# Patient Record
Sex: Female | Born: 1975 | Race: White | Hispanic: No | Marital: Single | Smoking: Current every day smoker
Health system: Southern US, Community
[De-identification: ages and names within clinical notes are randomized; demographics above are authoritative.]

## PROBLEM LIST (undated history)

## (undated) DIAGNOSIS — C539 Malignant neoplasm of cervix uteri, unspecified: Secondary | ICD-10-CM

## (undated) DIAGNOSIS — Z8619 Personal history of other infectious and parasitic diseases: Secondary | ICD-10-CM

## (undated) DIAGNOSIS — N63 Unspecified lump in unspecified breast: Secondary | ICD-10-CM

## (undated) DIAGNOSIS — G971 Other reaction to spinal and lumbar puncture: Secondary | ICD-10-CM

## (undated) HISTORY — PX: ABDOMINAL HYSTERECTOMY: SHX81

## (undated) HISTORY — DX: Personal history of other infectious and parasitic diseases: Z86.19

---

## 2005-01-19 ENCOUNTER — Other Ambulatory Visit: Admission: RE | Admit: 2005-01-19 | Discharge: 2005-01-19 | Payer: Self-pay | Admitting: Obstetrics & Gynecology

## 2005-01-20 ENCOUNTER — Other Ambulatory Visit: Admission: RE | Admit: 2005-01-20 | Discharge: 2005-01-20 | Payer: Self-pay | Admitting: Obstetrics and Gynecology

## 2005-02-02 ENCOUNTER — Observation Stay (HOSPITAL_COMMUNITY): Admission: RE | Admit: 2005-02-02 | Discharge: 2005-02-03 | Payer: Self-pay | Admitting: Obstetrics & Gynecology

## 2005-04-16 ENCOUNTER — Inpatient Hospital Stay (HOSPITAL_COMMUNITY): Admission: AD | Admit: 2005-04-16 | Discharge: 2005-04-16 | Payer: Self-pay | Admitting: Obstetrics & Gynecology

## 2005-04-21 ENCOUNTER — Inpatient Hospital Stay (HOSPITAL_COMMUNITY): Admission: RE | Admit: 2005-04-21 | Discharge: 2005-04-24 | Payer: Self-pay | Admitting: Obstetrics & Gynecology

## 2010-06-29 HISTORY — PX: OTHER SURGICAL HISTORY: SHX169

## 2012-08-23 ENCOUNTER — Emergency Department: Payer: Self-pay | Admitting: Emergency Medicine

## 2012-08-23 LAB — COMPREHENSIVE METABOLIC PANEL
Albumin: 3.7 g/dL (ref 3.4–5.0)
Alkaline Phosphatase: 125 U/L (ref 50–136)
Bilirubin,Total: 0.4 mg/dL (ref 0.2–1.0)
Calcium, Total: 9 mg/dL (ref 8.5–10.1)
Co2: 28 mmol/L (ref 21–32)
EGFR (African American): >60
EGFR (Non-African Amer.): >60
Glucose: 86 mg/dL (ref 65–99)
Potassium: 3.8 mmol/L (ref 3.5–5.1)
SGOT(AST): 15 U/L (ref 15–37)
SGPT (ALT): 28 U/L (ref 12–78)
Sodium: 137 mmol/L (ref 136–145)
Total Protein: 8.7 g/dL — ABNORMAL HIGH (ref 6.4–8.2)

## 2012-08-23 LAB — CBC
HCT: 40.2 % (ref 35.0–47.0)
MCHC: 34.2 g/dL (ref 32.0–36.0)
MCV: 88 fL (ref 80–100)
RDW: 15.5 % — ABNORMAL HIGH (ref 11.5–14.5)
WBC: 8 10*3/uL (ref 3.6–11.0)

## 2012-08-23 LAB — DRUG SCREEN, URINE
Barbiturates, Ur Screen: NEGATIVE (ref ?–200)
Benzodiazepine, Ur Scrn: NEGATIVE (ref ?–200)
Cannabinoid 50 Ng, Ur ~~LOC~~: POSITIVE (ref ?–50)
Cocaine Metabolite,Ur ~~LOC~~: NEGATIVE (ref ?–300)
Methadone, Ur Screen: NEGATIVE (ref ?–300)
Opiate, Ur Screen: POSITIVE (ref ?–300)
Phencyclidine (PCP) Ur S: NEGATIVE (ref ?–25)

## 2012-08-23 LAB — TSH: Thyroid Stimulating Horm: 1.25 u[IU]/mL

## 2012-08-23 LAB — URINALYSIS, COMPLETE
Bilirubin,UR: NEGATIVE
Blood: NEGATIVE
Glucose,UR: NEGATIVE mg/dL (ref 0–75)
Ph: 5 (ref 4.5–8.0)
Specific Gravity: 1.031 (ref 1.003–1.030)

## 2012-08-23 LAB — ETHANOL: Ethanol: <3 mg/dL

## 2013-07-15 DIAGNOSIS — Z3490 Encounter for supervision of normal pregnancy, unspecified, unspecified trimester: Secondary | ICD-10-CM | POA: Insufficient documentation

## 2013-11-22 ENCOUNTER — Ambulatory Visit: Payer: Self-pay

## 2014-05-31 ENCOUNTER — Emergency Department: Payer: Self-pay | Admitting: Emergency Medicine

## 2014-05-31 LAB — CBC WITH DIFFERENTIAL/PLATELET
BASOS PCT: 0.4 %
Basophil #: 0 10*3/uL (ref 0.0–0.1)
EOS PCT: 1.1 %
Eosinophil #: 0.1 10*3/uL (ref 0.0–0.7)
HCT: 38.8 % (ref 35.0–47.0)
HGB: 13.3 g/dL (ref 12.0–16.0)
Lymphocyte #: 2.3 10*3/uL (ref 1.0–3.6)
Lymphocyte %: 21.6 %
MCH: 32.2 pg (ref 26.0–34.0)
MCHC: 34.2 g/dL (ref 32.0–36.0)
MCV: 94 fL (ref 80–100)
MONO ABS: 0.7 x10 3/mm (ref 0.2–0.9)
MONOS PCT: 6.9 %
Neutrophil #: 7.4 10*3/uL — ABNORMAL HIGH (ref 1.4–6.5)
Neutrophil %: 70 %
PLATELETS: 192 10*3/uL (ref 150–440)
RBC: 4.12 10*6/uL (ref 3.80–5.20)
RDW: 13.8 % (ref 11.5–14.5)
WBC: 10.6 10*3/uL (ref 3.6–11.0)

## 2014-05-31 LAB — BASIC METABOLIC PANEL
Anion Gap: 4 — ABNORMAL LOW (ref 7–16)
BUN: 10 mg/dL (ref 7–18)
CALCIUM: 8.8 mg/dL (ref 8.5–10.1)
CHLORIDE: 108 mmol/L — AB (ref 98–107)
Co2: 28 mmol/L (ref 21–32)
Creatinine: 0.83 mg/dL (ref 0.60–1.30)
GLUCOSE: 95 mg/dL (ref 65–99)
OSMOLALITY: 278 (ref 275–301)
POTASSIUM: 4.4 mmol/L (ref 3.5–5.1)
Sodium: 140 mmol/L (ref 136–145)

## 2014-09-07 ENCOUNTER — Emergency Department: Payer: Self-pay | Admitting: Emergency Medicine

## 2016-06-29 DIAGNOSIS — C539 Malignant neoplasm of cervix uteri, unspecified: Secondary | ICD-10-CM

## 2016-06-29 HISTORY — PX: ABDOMINAL HYSTERECTOMY: SHX81

## 2016-06-29 HISTORY — DX: Malignant neoplasm of cervix uteri, unspecified: C53.9

## 2017-04-14 DIAGNOSIS — C539 Malignant neoplasm of cervix uteri, unspecified: Secondary | ICD-10-CM | POA: Insufficient documentation

## 2017-10-08 ENCOUNTER — Encounter: Payer: Self-pay | Admitting: Emergency Medicine

## 2017-10-08 ENCOUNTER — Emergency Department
Admission: EM | Admit: 2017-10-08 | Discharge: 2017-10-08 | Disposition: A | Discharge status: Home / Self Care | Payer: Self-pay | Attending: Emergency Medicine | Admitting: Emergency Medicine

## 2017-10-08 DIAGNOSIS — F17210 Nicotine dependence, cigarettes, uncomplicated: Secondary | ICD-10-CM | POA: Insufficient documentation

## 2017-10-08 DIAGNOSIS — N898 Other specified noninflammatory disorders of vagina: Secondary | ICD-10-CM

## 2017-10-08 DIAGNOSIS — Z8541 Personal history of malignant neoplasm of cervix uteri: Secondary | ICD-10-CM | POA: Insufficient documentation

## 2017-10-08 DIAGNOSIS — N390 Urinary tract infection, site not specified: Secondary | ICD-10-CM | POA: Insufficient documentation

## 2017-10-08 HISTORY — DX: Malignant neoplasm of cervix uteri, unspecified: C53.9

## 2017-10-08 LAB — URINALYSIS, COMPLETE (UACMP) WITH MICROSCOPIC
Bilirubin Urine: NEGATIVE
Glucose, UA: NEGATIVE mg/dL
Hgb urine dipstick: NEGATIVE
Ketones, ur: NEGATIVE mg/dL
Nitrite: POSITIVE — AB
Protein, ur: NEGATIVE mg/dL
Specific Gravity, Urine: 1.017 (ref 1.005–1.030)
pH: 5 (ref 5.0–8.0)

## 2017-10-08 LAB — WET PREP, GENITAL
Clue Cells Wet Prep HPF POC: NONE SEEN
SPERM: NONE SEEN
Trich, Wet Prep: NONE SEEN
Yeast Wet Prep HPF POC: NONE SEEN

## 2017-10-08 LAB — BASIC METABOLIC PANEL
Anion gap: 8 (ref 5–15)
BUN: 9 mg/dL (ref 6–20)
CHLORIDE: 104 mmol/L (ref 101–111)
CO2: 22 mmol/L (ref 22–32)
CREATININE: 0.72 mg/dL (ref 0.44–1.00)
Calcium: 8.9 mg/dL (ref 8.9–10.3)
GFR calc Af Amer: >60 mL/min (ref >60)
GFR calc non Af Amer: >60 mL/min (ref >60)
Glucose, Bld: 97 mg/dL (ref 65–99)
POTASSIUM: 4.4 mmol/L (ref 3.5–5.1)
Sodium: 134 mmol/L — ABNORMAL LOW (ref 135–145)

## 2017-10-08 LAB — CHLAMYDIA/NGC RT PCR (ARMC ONLY)
Chlamydia Tr: NOT DETECTED
N gonorrhoeae: NOT DETECTED

## 2017-10-08 MED ORDER — CEPHALEXIN 500 MG PO CAPS
500.0000 mg | ORAL_CAPSULE | Freq: Once | ORAL | Status: AC
  Administered 2017-10-08: 500 mg via ORAL
  Filled 2017-10-08: qty 1

## 2017-10-08 MED ORDER — CEPHALEXIN 500 MG PO CAPS: 500.0000 mg | ORAL_CAPSULE | Freq: Three times a day (TID) | ORAL | 0 refills | Status: DC

## 2017-10-08 NOTE — ED Triage Notes (Signed)
Patient presents to the ED with painful urination x 3 weeks with thick white vaginal discharge.  Patient reports halting urination and "spasms".  Patient had a hysterectomy in mid-December.

## 2017-10-08 NOTE — ED Provider Notes (Signed)
[facility] Emergency Department Provider Note  Time seen: 8:27 AM  I have reviewed the triage vital signs and the nursing notes.   HISTORY  Chief Complaint Dysuria    HPI Claire Harris is a 42 y.o. female with a past medical history of cervical cancer status post total hysterectomy 4 months ago.  Patient states ever since her hysterectomy 4 months ago she has been having difficulty urinating which she describes as bladder spasm.  States she will urinate a very small amount, states has a very foul odor to it will then need to urinate shortly afterwards again a small amount with urinary frequency.  Denies any dysuria or hematuria.  States the urine is often dark in color foul-smelling and shortly after urinating she feels like she has the urge to urinate again.  She also states over the past 1 week she has been having a thick white vaginal discharge.  Denies any fever, nausea, vomiting.  Denies any abdominal pain, chest pain, largely negative review of systems.   Past Medical History:  Diagnosis Date  . Cervical cancer ([place])     There are no active problems to display for this patient.   Past Surgical History:  Procedure Laterality Date  . ABDOMINAL HYSTERECTOMY     partial/radical  . arm surgery  2012    Prior to Admission medications   Not on File    No Known Allergies  No family history on file.  Social History Social History   Tobacco Use  . Smoking status: Current Every Day Smoker    Packs/day: 1.00    Types: Cigarettes  . Smokeless tobacco: Never Used  Substance Use Topics  . Alcohol use: Not Currently  . Drug use: Not on file    Review of Systems Constitutional: Negative for fever. Eyes: Negative for visual complaints ENT: Negative for recent illness/congestion Cardiovascular: Negative for chest pain. Respiratory: Negative for shortness of breath. Gastrointestinal: Negative for abdominal pain, vomiting and  diarrhea. Genitourinary: Urinary frequency, dark foul-smelling urine.  Thick vaginal discharge times 1 week. Musculoskeletal: Negative for musculoskeletal complaints Skin: Negative for skin complaints  Neurological: Negative for headache All other ROS negative  ____________________________________________   PHYSICAL EXAM:  VITAL SIGNS: ED Triage Vitals  Enc Vitals Group     BP 10/08/17 0731 111/65     Pulse Rate 10/08/17 0731 77     Resp 10/08/17 0731 18     Temp 10/08/17 0731 98.5 F (36.9 C)     Temp Source 10/08/17 0731 Oral     SpO2 10/08/17 0731 99 %     Weight 10/08/17 0732 160 lb (72.6 kg)     Height 10/08/17 0732 5\' 9"  (1.753 m)     Head Circumference --      Peak Flow --      Pain Score 10/08/17 0731 7     Pain Loc --      Pain Edu? --      Excl. in [place]? --    Constitutional: Alert and oriented. Well appearing and in no distress. Eyes: Normal exam ENT   Head: Normocephalic and atraumatic.   Mouth/Throat: Mucous membranes are moist. Cardiovascular: Normal rate, regular rhythm. No murmur Respiratory: Normal respiratory effort without tachypnea nor retractions. Breath sounds are clear  Gastrointestinal: Soft, completely nontender abdominal exam.  No rebound or guarding or distention. Musculoskeletal: Nontender with normal range of motion in all extremities. Neurologic:  Normal speech and language. No gross focal neurologic deficits Skin:  Skin is warm, dry and intact.  Psychiatric: Mood and affect are normal.  ____________________________________________  INITIAL IMPRESSION / ASSESSMENT AND PLAN / ED COURSE  Pertinent labs & imaging results that were available during my care of the patient were reviewed by me and considered in my medical decision making (see chart for details).  Patient presents to the emergency department for urinary frequency and thick vaginal discharge.  Differential would include urinary tract infection, pelvic infection/STD.  We will  check labs including urinalysis and perform a pelvic examination.  Patient agreeable to this plan of care.  Overall patient appears well, reassuring physical exam, normal vitals.  Patient's BMP shows normal kidney function.  CBC has hemolyzed, patient is a very difficult IV stick, we will hold off on CBC testing at this time as the patient appears very well with normal vitals and overall very well-appearing physical examination without concerning findings such as fever or pain.  We will still perform a pelvic examination, urinalysis pending.  Exam shows mild to moderate amount of white discharge.  No obvious tenderness  His wet prep is negative.  I discussed with the patient treatment for STDs, she thinks she has had a very low risk for STDs and wishes to wait for the results to return before any type of treatment.  I believe this is reasonable.  Patient's urinalysis consistent with urinary tract infection.  We will placed on Keflex 3 times daily times 10 days.  Urine culture has been sent.  Patient agreeable to this plan of care.  ____________________________________________   FINAL CLINICAL IMPRESSION(S) / ED DIAGNOSES  Urinary tract infection Vaginal discharge    Harvest Dark, MD 10/08/17 1107

## 2017-10-10 LAB — URINE CULTURE: Culture: >=100000 — AB

## 2017-11-11 ENCOUNTER — Emergency Department
Admission: EM | Admit: 2017-11-11 | Discharge: 2017-11-11 | Disposition: A | Discharge status: Home / Self Care | Payer: Self-pay | Attending: Emergency Medicine | Admitting: Emergency Medicine

## 2017-11-11 ENCOUNTER — Other Ambulatory Visit: Payer: Self-pay

## 2017-11-11 ENCOUNTER — Encounter: Payer: Self-pay | Admitting: Emergency Medicine

## 2017-11-11 DIAGNOSIS — R05 Cough: Secondary | ICD-10-CM | POA: Insufficient documentation

## 2017-11-11 DIAGNOSIS — R509 Fever, unspecified: Secondary | ICD-10-CM | POA: Insufficient documentation

## 2017-11-11 DIAGNOSIS — Z5321 Procedure and treatment not carried out due to patient leaving prior to being seen by health care provider: Secondary | ICD-10-CM | POA: Insufficient documentation

## 2017-11-11 NOTE — ED Triage Notes (Signed)
C/O fever, cough, chills x 2-3 days.

## 2017-12-23 ENCOUNTER — Emergency Department
Admission: EM | Admit: 2017-12-23 | Discharge: 2017-12-23 | Disposition: A | Discharge status: Home / Self Care | Payer: Commercial Managed Care - PPO | Attending: Emergency Medicine | Admitting: Emergency Medicine

## 2017-12-23 ENCOUNTER — Emergency Department: Payer: Commercial Managed Care - PPO

## 2017-12-23 ENCOUNTER — Encounter: Payer: Self-pay | Admitting: Emergency Medicine

## 2017-12-23 DIAGNOSIS — K529 Noninfective gastroenteritis and colitis, unspecified: Secondary | ICD-10-CM | POA: Diagnosis not present

## 2017-12-23 DIAGNOSIS — F17210 Nicotine dependence, cigarettes, uncomplicated: Secondary | ICD-10-CM | POA: Diagnosis not present

## 2017-12-23 DIAGNOSIS — R1033 Periumbilical pain: Secondary | ICD-10-CM | POA: Diagnosis present

## 2017-12-23 LAB — COMPREHENSIVE METABOLIC PANEL
ALBUMIN: 4.1 g/dL (ref 3.5–5.0)
ALT: 40 U/L (ref 0–44)
ANION GAP: 12 (ref 5–15)
AST: 38 U/L (ref 15–41)
Alkaline Phosphatase: 89 U/L (ref 38–126)
BUN: 10 mg/dL (ref 6–20)
CO2: 22 mmol/L (ref 22–32)
Calcium: 9.5 mg/dL (ref 8.9–10.3)
Chloride: 103 mmol/L (ref 98–111)
Creatinine, Ser: 0.73 mg/dL (ref 0.44–1.00)
GFR calc Af Amer: >60 mL/min (ref >60)
GFR calc non Af Amer: >60 mL/min (ref >60)
GLUCOSE: 135 mg/dL — AB (ref 70–99)
POTASSIUM: 3.9 mmol/L (ref 3.5–5.1)
SODIUM: 137 mmol/L (ref 135–145)
Total Bilirubin: 0.4 mg/dL (ref 0.3–1.2)
Total Protein: 8.7 g/dL — ABNORMAL HIGH (ref 6.5–8.1)

## 2017-12-23 LAB — CBC
HEMATOCRIT: 47 % (ref 35.0–47.0)
HEMOGLOBIN: 16 g/dL (ref 12.0–16.0)
MCH: 31.2 pg (ref 26.0–34.0)
MCHC: 34.1 g/dL (ref 32.0–36.0)
MCV: 91.7 fL (ref 80.0–100.0)
Platelets: 183 10*3/uL (ref 150–440)
RBC: 5.13 MIL/uL (ref 3.80–5.20)
RDW: 15.3 % — ABNORMAL HIGH (ref 11.5–14.5)
WBC: 14.2 10*3/uL — ABNORMAL HIGH (ref 3.6–11.0)

## 2017-12-23 LAB — LIPASE, BLOOD: Lipase: 26 U/L (ref 11–51)

## 2017-12-23 LAB — TROPONIN I: Troponin I: <0.03 ng/mL (ref <0.03)

## 2017-12-23 MED ORDER — MORPHINE SULFATE (PF) 4 MG/ML IV SOLN
4.0000 mg | Freq: Once | INTRAVENOUS | Status: AC
  Administered 2017-12-23: 4 mg via INTRAVENOUS
  Filled 2017-12-23: qty 1

## 2017-12-23 MED ORDER — OXYCODONE-ACETAMINOPHEN 5-325 MG PO TABS
1.0000 | ORAL_TABLET | Freq: Once | ORAL | Status: AC
  Administered 2017-12-23: 1 via ORAL
  Filled 2017-12-23: qty 1

## 2017-12-23 MED ORDER — ONDANSETRON HCL 4 MG/2ML IJ SOLN
4.0000 mg | Freq: Once | INTRAMUSCULAR | Status: AC
  Administered 2017-12-23: 4 mg via INTRAVENOUS
  Filled 2017-12-23: qty 2

## 2017-12-23 MED ORDER — METRONIDAZOLE 500 MG PO TABS: 500.0000 mg | ORAL_TABLET | Freq: Two times a day (BID) | ORAL | 0 refills | Status: DC

## 2017-12-23 MED ORDER — HYDROCODONE-ACETAMINOPHEN 5-325 MG PO TABS: 1.0000 | ORAL_TABLET | Freq: Four times a day (QID) | ORAL | 0 refills | Status: DC | PRN

## 2017-12-23 MED ORDER — IOPAMIDOL (ISOVUE-300) INJECTION 61%
100.0000 mL | Freq: Once | INTRAVENOUS | Status: AC | PRN
  Administered 2017-12-23: 100 mL via INTRAVENOUS

## 2017-12-23 MED ORDER — CIPROFLOXACIN HCL 500 MG PO TABS: 500.0000 mg | ORAL_TABLET | Freq: Two times a day (BID) | ORAL | 0 refills | Status: DC

## 2017-12-23 NOTE — ED Provider Notes (Signed)
[facility] Emergency Department Provider Note   ____________________________________________    I have reviewed the triage vital signs and the nursing notes.   HISTORY  Chief Complaint Abdominal Pain     HPI Claire Harris is a 42 y.o. female with a history of cervical cancer, status post hysterectomy who presents with complaints of sharp stabbing severe periumbilical abdominal pain which started overnight.  Patient reports gradual onset.  She reports she works third shift, woke up at 8 PM, felt dizzy and sweaty developed nausea and vomiting with the abdominal pain as described above.  She reports this is been continuous throughout the night.  She is never had this before.  No fevers or chills.  No diarrhea.  No recent travel.  Past Medical History:  Diagnosis Date  . Cervical cancer ([place])     There are no active problems to display for this patient.   Past Surgical History:  Procedure Laterality Date  . ABDOMINAL HYSTERECTOMY     partial/radical  . arm surgery  2012    Prior to Admission medications   Medication Sig Start Date End Date Taking? Authorizing Provider  acetaminophen (TYLENOL) 325 MG tablet Take 650 mg by mouth every 6 (six) hours as needed for mild pain or moderate pain.   Yes [provider]  docusate sodium (COLACE) 100 MG capsule Take 100 mg by mouth 3 (three) times daily as needed for mild constipation.   Yes [provider]  ibuprofen (ADVIL,MOTRIN) 800 MG tablet Take 800 mg by mouth every 8 (eight) hours as needed for moderate pain.   Yes [provider]  senna (SENOKOT) 8.6 MG TABS tablet Take 1 tablet by mouth 3 (three) times daily as needed for mild constipation.   Yes [provider]  cephALEXin (KEFLEX) 500 MG capsule Take 1 capsule (500 mg total) by mouth 3 (three) times daily. Patient not taking: Reported on 12/23/2017 10/08/17   Harvest Dark, MD  ciprofloxacin (CIPRO) 500 MG  tablet Take 1 tablet (500 mg total) by mouth 2 (two) times daily. 12/23/17   Lavonia Drafts, MD  HYDROcodone-acetaminophen (NORCO/VICODIN) 5-325 MG tablet Take 1 tablet by mouth every 6 (six) hours as needed for severe pain. 12/23/17   Lavonia Drafts, MD  metroNIDAZOLE (FLAGYL) 500 MG tablet Take 1 tablet (500 mg total) by mouth 2 (two) times daily after a meal. 12/23/17   Lavonia Drafts, MD     Allergies Patient has no known allergies.  History reviewed. No pertinent family history.  Social History Social History   Tobacco Use  . Smoking status: Current Every Day Smoker    Packs/day: 1.00    Types: Cigarettes  . Smokeless tobacco: Never Used  Substance Use Topics  . Alcohol use: Not Currently  . Drug use: Not on file    Review of Systems  Constitutional: No fever/chills Eyes: No visual changes.  ENT: No sore throat. Cardiovascular: Denies chest pain. Respiratory: Denies shortness of breath. Gastrointestinal: As above Genitourinary: Negative for dysuria.  No vaginal discharge Musculoskeletal: Negative for back pain. Skin: Negative for rash. Neurological: Negative for headaches   ____________________________________________   PHYSICAL EXAM:  VITAL SIGNS: ED Triage Vitals [12/23/17 0642]  Enc Vitals Group     BP 111/78     Pulse Rate 70     Resp 18     Temp 98.1 F (36.7 C)     Temp Source Oral     SpO2 99 %  Weight 72.6 kg (160 lb)     Height      Head Circumference      Peak Flow      Pain Score      Pain Loc      Pain Edu?      Excl. in [place]?     Constitutional: Alert and oriented.  Eyes: Conjunctivae are normal.   Nose: No congestion/rhinnorhea. Mouth/Throat: Mucous membranes are moist.    Cardiovascular: Normal rate, regular rhythm. Grossly normal heart sounds.  Good peripheral circulation. Respiratory: Normal respiratory effort.  No retractions. Lungs CTAB. Gastrointestinal: Mild periumbilical tenderness to palpation, no significant distention.   No CVA tenderness Genitourinary: deferred Musculoskeletal:  Warm and well perfused Neurologic:  Normal speech and language. No gross focal neurologic deficits are appreciated.  Skin:  Skin is warm, dry and intact. No rash noted. Psychiatric: Mood and affect are normal. Speech and behavior are normal.  ____________________________________________   LABS (all labs ordered are listed, but only abnormal results are displayed)  Labs Reviewed  COMPREHENSIVE METABOLIC PANEL - Abnormal; Notable for the following components:      Result Value   Glucose, Bld 135 (*)    Total Protein 8.7 (*)    All other components within normal limits  CBC - Abnormal; Notable for the following components:   WBC 14.2 (*)    RDW 15.3 (*)    All other components within normal limits  LIPASE, BLOOD  TROPONIN I  URINALYSIS, COMPLETE (UACMP) WITH MICROSCOPIC   ____________________________________________  EKG  ED ECG REPORT I, Lavonia Drafts, the attending physician, personally viewed and interpreted this ECG.  Date: 12/23/2017  Rhythm: normal sinus rhythm QRS Axis: normal Intervals: normal ST/T Wave abnormalities: normal Narrative Interpretation: no evidence of acute ischemia  ____________________________________________  RADIOLOGY  CT scan consistent with colitis ____________________________________________   PROCEDURES  Procedure(s) performed: No  Procedures   Critical Care performed: No ____________________________________________   INITIAL IMPRESSION / ASSESSMENT AND PLAN / ED COURSE  Pertinent labs & imaging results that were available during my care of the patient were reviewed by me and considered in my medical decision making (see chart for details).  Patient presents with periumbilical abdominal pain, abdominal exam demonstrates mild tenderness but no distention.  Vital signs unremarkable.  Pending labs.  We will give IV morphine and IV Zofran and IV fluids.  Differential  includes enteritis, gastroenteritis, colitis,  less likely appendicitis.   CT scan is consistent with colitis.  Patient feels significant better after treatment, vitals are normal.  She has not had any diarrhea.  Appropriate for discharge at this point with antibiotics and close outpatient follow-up.  Return precautions discussed    ____________________________________________   FINAL CLINICAL IMPRESSION(S) / ED DIAGNOSES  Final diagnoses:  Colitis        Note:  This document was prepared using Dragon voice recognition software and may include unintentional dictation errors.    Lavonia Drafts, MD 12/23/17 1041

## 2017-12-23 NOTE — ED Triage Notes (Signed)
Pt arrived via EMS from home with all 4 quad. Abdominal pain since last night at 2000. Pt c/o distended abdomin, N/V. Pain described as stabbing. Zofran given in route by EMS.

## 2019-02-05 ENCOUNTER — Other Ambulatory Visit: Payer: Self-pay

## 2019-02-05 ENCOUNTER — Emergency Department
Admission: EM | Admit: 2019-02-05 | Discharge: 2019-02-06 | Disposition: A | Discharge status: Home / Self Care | Payer: Commercial Managed Care - PPO | Attending: Emergency Medicine | Admitting: Emergency Medicine

## 2019-02-05 DIAGNOSIS — N390 Urinary tract infection, site not specified: Secondary | ICD-10-CM | POA: Insufficient documentation

## 2019-02-05 DIAGNOSIS — R300 Dysuria: Secondary | ICD-10-CM

## 2019-02-05 DIAGNOSIS — F4329 Adjustment disorder with other symptoms: Secondary | ICD-10-CM | POA: Insufficient documentation

## 2019-02-05 DIAGNOSIS — R440 Auditory hallucinations: Secondary | ICD-10-CM

## 2019-02-05 DIAGNOSIS — F17210 Nicotine dependence, cigarettes, uncomplicated: Secondary | ICD-10-CM | POA: Insufficient documentation

## 2019-02-05 DIAGNOSIS — Z79899 Other long term (current) drug therapy: Secondary | ICD-10-CM | POA: Insufficient documentation

## 2019-02-05 LAB — COMPREHENSIVE METABOLIC PANEL
ALT: 48 U/L — ABNORMAL HIGH (ref 0–44)
AST: 27 U/L (ref 15–41)
Albumin: 3.9 g/dL (ref 3.5–5.0)
Alkaline Phosphatase: 69 U/L (ref 38–126)
Anion gap: 8 (ref 5–15)
BUN: 9 mg/dL (ref 6–20)
CO2: 27 mmol/L (ref 22–32)
Calcium: 9.2 mg/dL (ref 8.9–10.3)
Chloride: 104 mmol/L (ref 98–111)
Creatinine, Ser: 0.59 mg/dL (ref 0.44–1.00)
GFR calc Af Amer: >60 mL/min (ref >60)
GFR calc non Af Amer: >60 mL/min (ref >60)
Glucose, Bld: 95 mg/dL (ref 70–99)
Potassium: 4 mmol/L (ref 3.5–5.1)
Sodium: 139 mmol/L (ref 135–145)
Total Bilirubin: 0.5 mg/dL (ref 0.3–1.2)
Total Protein: 7.6 g/dL (ref 6.5–8.1)

## 2019-02-05 LAB — URINE DRUG SCREEN, QUALITATIVE (ARMC ONLY)
Amphetamines, Ur Screen: NOT DETECTED
Barbiturates, Ur Screen: NOT DETECTED
Benzodiazepine, Ur Scrn: NOT DETECTED
Cannabinoid 50 Ng, Ur ~~LOC~~: POSITIVE — AB
Cocaine Metabolite,Ur ~~LOC~~: NOT DETECTED
MDMA (Ecstasy)Ur Screen: NOT DETECTED
Methadone Scn, Ur: NOT DETECTED
Opiate, Ur Screen: NOT DETECTED
Phencyclidine (PCP) Ur S: NOT DETECTED
Tricyclic, Ur Screen: NOT DETECTED

## 2019-02-05 LAB — CBC
HCT: 42.1 % (ref 36.0–46.0)
Hemoglobin: 14.4 g/dL (ref 12.0–15.0)
MCH: 32.5 pg (ref 26.0–34.0)
MCHC: 34.2 g/dL (ref 30.0–36.0)
MCV: 95 fL (ref 80.0–100.0)
Platelets: 181 10*3/uL (ref 150–400)
RBC: 4.43 MIL/uL (ref 3.87–5.11)
RDW: 14.3 % (ref 11.5–15.5)
WBC: 7.5 10*3/uL (ref 4.0–10.5)
nRBC: 0 % (ref 0.0–0.2)

## 2019-02-05 LAB — URINALYSIS, COMPLETE (UACMP) WITH MICROSCOPIC
Bacteria, UA: NONE SEEN
Bilirubin Urine: NEGATIVE
Glucose, UA: NEGATIVE mg/dL
Hgb urine dipstick: NEGATIVE
Ketones, ur: NEGATIVE mg/dL
Leukocytes,Ua: NEGATIVE
Nitrite: NEGATIVE
Protein, ur: NEGATIVE mg/dL
Specific Gravity, Urine: 1.009 (ref 1.005–1.030)
pH: 5 (ref 5.0–8.0)

## 2019-02-05 LAB — ETHANOL: Alcohol, Ethyl (B): <10 mg/dL (ref <10)

## 2019-02-05 MED ORDER — CEFUROXIME AXETIL 250 MG PO TABS: 250.0000 mg | ORAL_TABLET | Freq: Two times a day (BID) | ORAL | 0 refills | Status: DC

## 2019-02-05 NOTE — Discharge Instructions (Addendum)
We are treating you for a possible UTI.  We also had our psychiatric team come and evaluate to give you additional resources.  Return to the ER for worsening symptoms or any other concerns.

## 2019-02-05 NOTE — ED Notes (Signed)
Pt yelling loudly from room using foul language and screaming at someone to leave her alone.  This RN entered room to ask pt to tone down the volume and see if she needed any assistance.  Pt states to this RN that there must be a satellite above the Emergency Department and that she just wants the people to leave her alone.  She states that maybe she needs to call the police.  When this RN asks if the people are calling her on the phone she states "it's the satellite above the building."  [place], [place] notified of same.

## 2019-02-05 NOTE — ED Notes (Signed)
FIRST NURSE NOTE:  Pt reports UTI sxs, pt provided mask on arrival to ED.

## 2019-02-05 NOTE — ED Triage Notes (Signed)
Pt presents via POV c/o dysuria. Reports possible UTI. Symptoms x3-4 days.

## 2019-02-05 NOTE — ED Provider Notes (Signed)
[facility] Emergency Department Provider Note  ____________________________________________   First MD Initiated Contact with Patient 02/05/19 1745     (approximate)  I have reviewed the triage vital signs and the nursing notes.   HISTORY  Chief Complaint Dysuria    HPI Claire Harris is a 43 y.o. female presents emergency department with complaints of dysuria.  However while we are waiting her urine test the patient begins to scream and yell at someone to leave her alone but there is no one in the room.  The nurse went in to talk to her and the patient told her that someone was bothering her through the satellite and that she wanted them to be arrested.  I myself went in and the patient told me that she keeps hearing a man talk to her.  He is a famous Scientist, clinical (histocompatibility and immunogenetics).  She thinks he is talking to her after satellites.  When I asked if she was speaking to him on the phone she said no "hold on a minute let me see if I can hear him right this minute "and told me at that time she did not hear him.  In the patient's past medical history are not seeing any history of psych issues.  She will be moved to the major side at this time.    Past Medical History:  Diagnosis Date   Cervical cancer ([place])     There are no active problems to display for this patient.   Past Surgical History:  Procedure Laterality Date   ABDOMINAL HYSTERECTOMY     partial/radical   arm surgery  2012    Prior to Admission medications   Medication Sig Start Date End Date Taking? Authorizing Provider  acetaminophen (TYLENOL) 325 MG tablet Take 650 mg by mouth every 6 (six) hours as needed for mild pain or moderate pain.    [provider]  cephALEXin (KEFLEX) 500 MG capsule Take 1 capsule (500 mg total) by mouth 3 (three) times daily. Patient not taking: Reported on 12/23/2017 10/08/17   Harvest Dark, MD  ciprofloxacin (CIPRO) 500 MG tablet Take 1 tablet (500 mg total) by  mouth 2 (two) times daily. 12/23/17   Lavonia Drafts, MD  docusate sodium (COLACE) 100 MG capsule Take 100 mg by mouth 3 (three) times daily as needed for mild constipation.    [provider]  HYDROcodone-acetaminophen (NORCO/VICODIN) 5-325 MG tablet Take 1 tablet by mouth every 6 (six) hours as needed for severe pain. 12/23/17   Lavonia Drafts, MD  ibuprofen (ADVIL,MOTRIN) 800 MG tablet Take 800 mg by mouth every 8 (eight) hours as needed for moderate pain.    [provider]  metroNIDAZOLE (FLAGYL) 500 MG tablet Take 1 tablet (500 mg total) by mouth 2 (two) times daily after a meal. 12/23/17   Lavonia Drafts, MD  senna (SENOKOT) 8.6 MG TABS tablet Take 1 tablet by mouth 3 (three) times daily as needed for mild constipation.    [provider]    Allergies Patient has no known allergies.  History reviewed. No pertinent family history.  Social History Social History   Tobacco Use   Smoking status: Current Every Day Smoker    Packs/day: 1.00    Types: Cigarettes   Smokeless tobacco: Never Used  Substance Use Topics   Alcohol use: Not Currently   Drug use: Not on file    Review of Systems  Constitutional: No fever/chills, Eyes: No visual changes. ENT: No sore throat. Respiratory: Denies  cough Genitourinary: Positive for dysuria. Musculoskeletal: Negative for back pain. Skin: Negative for rash. Psychiatric: Positive for hearing voices    ____________________________________________   PHYSICAL EXAM:  VITAL SIGNS: ED Triage Vitals  Enc Vitals Group     BP 02/05/19 1717 114/77     Pulse Rate 02/05/19 1717 94     Resp --      Temp 02/05/19 1717 98.2 F (36.8 C)     Temp Source 02/05/19 1717 Oral     SpO2 02/05/19 1717 96 %     Weight 02/05/19 1717 128 lb (58.1 kg)     Height 02/05/19 1717 5\' 9"  (1.753 m)     Head Circumference --      Peak Flow --      Pain Score 02/05/19 1721 0     Pain Loc --      Pain Edu? --      Excl. in [place]? --       Constitutional: Alert and oriented. Well appearing and in no acute distress. Eyes: Conjunctivae are normal.  Head: Atraumatic. Nose: No congestion/rhinnorhea. Mouth/Throat: Mucous membranes are moist.   Neck:  supple no lymphadenopathy noted Cardiovascular: Normal rate, regular rhythm. Heart sounds are normal Respiratory: Normal respiratory effort.  No retractions, lungs c t a  Abd: soft nontender bs normal all 4 quad GU: deferred Musculoskeletal: FROM all extremities, warm and well perfused Neurologic:  Normal speech and language.  Skin:  Skin is warm, dry and intact. No rash noted. Psychiatric: At first the patient's behavior is normal, now she is hearing voices and is agitated ____________________________________________   LABS (all labs ordered are listed, but only abnormal results are displayed)  Labs Reviewed  URINALYSIS, COMPLETE (UACMP) WITH MICROSCOPIC - Abnormal; Notable for the following components:      Result Value   Color, Urine YELLOW (*)    APPearance HAZY (*)    All other components within normal limits  URINE DRUG SCREEN, QUALITATIVE ([facility] ONLY)   ____________________________________________   ____________________________________________  RADIOLOGY    ____________________________________________   PROCEDURES  Procedure(s) performed: No  Procedures    ____________________________________________   INITIAL IMPRESSION / ASSESSMENT AND PLAN / ED COURSE  Pertinent labs & imaging results that were available during my care of the patient were reviewed by me and considered in my medical decision making (see chart for details).   Patient is 43 year old female presents emergency department with concerns of dysuria.  And the meantime she has started to complain of hearing voices and that someone is bothering her through the satellite.  Patient does not have a psych history as noted to her past medical history.  Urine drug screen is added to the  urinalysis.  She will be moved to the major side to be evaluated for the altered mental status.   Clinical Course as of Feb 04 2001  Nancy Fetter Feb 05, 2019  1951 Urine Drug Screen, Qualitative [facility] only) [SF]    Clinical Course User Index [SF] Versie Starks, PA-C   Claire Harris was evaluated in Emergency Department on 02/05/2019 for the symptoms described in the history of present illness. She was evaluated in the context of the global COVID-19 pandemic, which necessitated consideration that the patient might be at risk for infection with the SARS-CoV-2 virus that causes COVID-19. Institutional protocols and algorithms that pertain to the evaluation of patients at risk for COVID-19 are in a state of rapid change based on information released by regulatory bodies including the CDC  and federal and state organizations. These policies and algorithms were followed during the patient's care in the ED.   As part of my medical decision making, I reviewed the following data within the [facility] notes reviewed and incorporated, Old chart reviewed, Evaluated by EM attending Dr. Jari Pigg, Notes from prior ED visits and [place] Controlled Substance Database  ____________________________________________   FINAL CLINICAL IMPRESSION(S) / ED DIAGNOSES  Final diagnoses:  Dysuria  Hearing voices      NEW MEDICATIONS STARTED DURING THIS VISIT:  New Prescriptions   No medications on file     Note:  This document was prepared using Dragon voice recognition software and may include unintentional dictation errors.    Versie Starks, PA-C 02/05/19 Riley Lam, MD 02/11/19 1046

## 2019-02-05 NOTE — ED Provider Notes (Signed)
-----------------------------------------   11:59 PM on 02/05/2019 -----------------------------------------  Assuming care from Dr. Jari Pigg.  In short, Claire Harris is a 43 y.o. female with a chief complaint of dysuria and hearing voices.  Refer to the original H&P for additional details.  I read the written report from psychiatry and they agreed that the patient does not meet involuntary commitment criteria nor inpatient behavioral medicine treatment criteria.  The patient has been given outpatient resources and will be discharged for outpatient follow-up.  She was given return precautions and follow-up recommendations.   Hinda Kehr, MD 02/06/19 0000
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
## 2019-02-05 NOTE — ED Notes (Signed)
Report to [facility] MD

## 2019-02-07 LAB — URINE CULTURE: Culture: NO GROWTH

## 2019-02-08 ENCOUNTER — Encounter: Payer: Self-pay | Admitting: Emergency Medicine

## 2019-02-08 ENCOUNTER — Emergency Department
Admission: EM | Admit: 2019-02-08 | Discharge: 2019-02-09 | Disposition: A | Discharge status: Other Acute Inpatient Hospital | Payer: Medicaid Other | Attending: Emergency Medicine | Admitting: Emergency Medicine

## 2019-02-08 ENCOUNTER — Other Ambulatory Visit: Payer: Self-pay

## 2019-02-08 DIAGNOSIS — F22 Delusional disorders: Secondary | ICD-10-CM

## 2019-02-08 DIAGNOSIS — Z79899 Other long term (current) drug therapy: Secondary | ICD-10-CM | POA: Insufficient documentation

## 2019-02-08 DIAGNOSIS — Z20828 Contact with and (suspected) exposure to other viral communicable diseases: Secondary | ICD-10-CM | POA: Insufficient documentation

## 2019-02-08 DIAGNOSIS — R443 Hallucinations, unspecified: Secondary | ICD-10-CM | POA: Insufficient documentation

## 2019-02-08 DIAGNOSIS — Z8541 Personal history of malignant neoplasm of cervix uteri: Secondary | ICD-10-CM | POA: Insufficient documentation

## 2019-02-08 DIAGNOSIS — F17210 Nicotine dependence, cigarettes, uncomplicated: Secondary | ICD-10-CM | POA: Insufficient documentation

## 2019-02-08 DIAGNOSIS — F23 Brief psychotic disorder: Secondary | ICD-10-CM | POA: Insufficient documentation

## 2019-02-08 LAB — CBC
HCT: 44.7 % (ref 36.0–46.0)
Hemoglobin: 15.4 g/dL — ABNORMAL HIGH (ref 12.0–15.0)
MCH: 32.4 pg (ref 26.0–34.0)
MCHC: 34.5 g/dL (ref 30.0–36.0)
MCV: 94.1 fL (ref 80.0–100.0)
Platelets: 202 10*3/uL (ref 150–400)
RBC: 4.75 MIL/uL (ref 3.87–5.11)
RDW: 14.5 % (ref 11.5–15.5)
WBC: 6.7 10*3/uL (ref 4.0–10.5)
nRBC: 0 % (ref 0.0–0.2)

## 2019-02-08 LAB — COMPREHENSIVE METABOLIC PANEL
ALT: 57 U/L — ABNORMAL HIGH (ref 0–44)
AST: 33 U/L (ref 15–41)
Albumin: 4.4 g/dL (ref 3.5–5.0)
Alkaline Phosphatase: 78 U/L (ref 38–126)
Anion gap: 9 (ref 5–15)
BUN: 10 mg/dL (ref 6–20)
CO2: 28 mmol/L (ref 22–32)
Calcium: 9.6 mg/dL (ref 8.9–10.3)
Chloride: 101 mmol/L (ref 98–111)
Creatinine, Ser: 0.58 mg/dL (ref 0.44–1.00)
GFR calc Af Amer: >60 mL/min (ref >60)
GFR calc non Af Amer: >60 mL/min (ref >60)
Glucose, Bld: 102 mg/dL — ABNORMAL HIGH (ref 70–99)
Potassium: 4.4 mmol/L (ref 3.5–5.1)
Sodium: 138 mmol/L (ref 135–145)
Total Bilirubin: 0.5 mg/dL (ref 0.3–1.2)
Total Protein: 8.5 g/dL — ABNORMAL HIGH (ref 6.5–8.1)

## 2019-02-08 LAB — SARS CORONAVIRUS 2 BY RT PCR (HOSPITAL ORDER, PERFORMED IN ~~LOC~~ HOSPITAL LAB): SARS Coronavirus 2: NEGATIVE

## 2019-02-08 LAB — ETHANOL: Alcohol, Ethyl (B): <10 mg/dL (ref <10)

## 2019-02-08 LAB — ACETAMINOPHEN LEVEL: Acetaminophen (Tylenol), Serum: <10 ug/mL — ABNORMAL LOW (ref 10–30)

## 2019-02-08 LAB — SALICYLATE LEVEL: Salicylate Lvl: <7 mg/dL (ref 2.8–30.0)

## 2019-02-08 NOTE — ED Provider Notes (Signed)
[facility] Emergency Department Provider Note   ____________________________________________   First MD Initiated Contact with Patient 02/08/19 2010     (approximate)  I have reviewed the triage vital signs and the nursing notes.   HISTORY  Chief Complaint Delusions and hallucinations   HPI Claire Harris is a 43 y.o. female with no significant past medical history presents to the ED under IVC from [place].  Patient states that she was brought to see a therapist by police, where she thought she would be able to make a complaint against someone that is stalking her.  After she discussed the person that was talking her with the therapist, she states they did not believe her and involuntarily committed her.  Per IVC paperwork, patient with auditory hallucinations and delusions of being stalked by a "famous rapper".  She denies any SI or HI, specifically states she does not want to harm the person that stalking her, but she does have a reported history of violent behavior in the past.  She does not have any reported psychiatric history, was evaluated in the ED for similar symptoms a few days ago and discharged home.        Past Medical History:  Diagnosis Date  . Cervical cancer [facility])     Patient Active Problem List   Diagnosis Date Noted  . Paranoia ([place]) 02/08/2019    Past Surgical History:  Procedure Laterality Date  . ABDOMINAL HYSTERECTOMY     partial/radical  . arm surgery  2012    Prior to Admission medications   Medication Sig Start Date End Date Taking? Authorizing Provider  acetaminophen (TYLENOL) 325 MG tablet Take 650 mg by mouth every 6 (six) hours as needed for mild pain or moderate pain.    [provider]  cefUROXime (CEFTIN) 250 MG tablet Take 1 tablet (250 mg total) by mouth 2 (two) times daily with a meal for 10 days. 02/05/19 02/15/19  Vanessa Kirby, MD  cephALEXin (KEFLEX) 500 MG capsule Take 1 capsule (500 mg total) by mouth 3  (three) times daily. Patient not taking: Reported on 12/23/2017 10/08/17   Harvest Dark, MD  ciprofloxacin (CIPRO) 500 MG tablet Take 1 tablet (500 mg total) by mouth 2 (two) times daily. 12/23/17   Lavonia Drafts, MD  docusate sodium (COLACE) 100 MG capsule Take 100 mg by mouth 3 (three) times daily as needed for mild constipation.    [provider]  HYDROcodone-acetaminophen (NORCO/VICODIN) 5-325 MG tablet Take 1 tablet by mouth every 6 (six) hours as needed for severe pain. 12/23/17   Lavonia Drafts, MD  ibuprofen (ADVIL,MOTRIN) 800 MG tablet Take 800 mg by mouth every 8 (eight) hours as needed for moderate pain.    [provider]  metroNIDAZOLE (FLAGYL) 500 MG tablet Take 1 tablet (500 mg total) by mouth 2 (two) times daily after a meal. 12/23/17   Lavonia Drafts, MD  senna (SENOKOT) 8.6 MG TABS tablet Take 1 tablet by mouth 3 (three) times daily as needed for mild constipation.    [provider]    Allergies Patient has no known allergies.  No family history on file.  Social History Social History   Tobacco Use  . Smoking status: Current Every Day Smoker    Packs/day: 1.00    Types: Cigarettes  . Smokeless tobacco: Never Used  Substance Use Topics  . Alcohol use: Not Currently  . Drug use: Not Currently    Review of Systems  Constitutional: No  fever/chills Eyes: No visual changes. ENT: No sore throat. Cardiovascular: Denies chest pain. Respiratory: Denies shortness of breath. Gastrointestinal: No abdominal pain.  No nausea, no vomiting.  No diarrhea.  No constipation. Genitourinary: Negative for dysuria. Musculoskeletal: Negative for back pain. Skin: Negative for rash. Neurological: Negative for headaches, focal weakness or numbness.  ____________________________________________   PHYSICAL EXAM:  VITAL SIGNS: ED Triage Vitals  Enc Vitals Group     BP 02/08/19 1948 116/85     Pulse Rate 02/08/19 1948 77     Resp 02/08/19 1948 18      Temp --      Temp Source 02/08/19 1948 Oral     SpO2 02/08/19 1948 100 %     Weight 02/08/19 1947 130 lb (59 kg)     Height 02/08/19 1947 5\' 9"  (1.753 m)     Head Circumference --      Peak Flow --      Pain Score 02/08/19 1952 0     Pain Loc --      Pain Edu? --      Excl. in [place]? --     Constitutional: Alert and oriented. Eyes: Conjunctivae are normal. Head: Atraumatic. Nose: No congestion/rhinnorhea. Mouth/Throat: Mucous membranes are moist. Neck: Normal ROM Cardiovascular: Normal rate, regular rhythm. Grossly normal heart sounds. Respiratory: Normal respiratory effort.  No retractions. Lungs CTAB. Gastrointestinal: Soft and nontender. No distention. Genitourinary: deferred Musculoskeletal: No lower extremity tenderness nor edema. Neurologic:  Normal speech and language. No gross focal neurologic deficits are appreciated. Skin:  Skin is warm, dry and intact. No rash noted. Psychiatric: Mood and affect are normal.  Delusional thought content. ____________________________________________   LABS (all labs ordered are listed, but only abnormal results are displayed)  Labs Reviewed  COMPREHENSIVE METABOLIC PANEL - Abnormal; Notable for the following components:      Result Value   Glucose, Bld 102 (*)    Total Protein 8.5 (*)    ALT 57 (*)    All other components within normal limits  ACETAMINOPHEN LEVEL - Abnormal; Notable for the following components:   Acetaminophen (Tylenol), Serum <10 (*)    All other components within normal limits  CBC - Abnormal; Notable for the following components:   Hemoglobin 15.4 (*)    All other components within normal limits  SARS CORONAVIRUS 2 (HOSPITAL ORDER, [place] [facility])  ETHANOL  SALICYLATE LEVEL  URINE DRUG SCREEN, QUALITATIVE ([facility] ONLY)     PROCEDURES  Procedure(s) performed (including Critical Care):  Procedures   ____________________________________________   INITIAL IMPRESSION /  ASSESSMENT AND PLAN / ED COURSE       43 year old female with no significant psychiatric history presents to the ED under IVC from [place] for auditory hallucinations and delusions.  No apparent medical complaints at this time and screening labs unremarkable, patient medically cleared.  Will consult psychiatry for assistance with disposition.  Labs unremarkable and patient medically cleared.  Pending further evaluation by psychiatry to determine appropriate disposition.      ____________________________________________   FINAL CLINICAL IMPRESSION(S) / ED DIAGNOSES  Final diagnoses:  Delusions ([place])  Hallucinations     ED Discharge Orders    None       Note:  This document was prepared using Dragon voice recognition software and may include unintentional dictation errors.   Blake Divine, MD 02/08/19 [phone]

## 2019-02-08 NOTE — ED Notes (Signed)
Pt. Transferred from Triage to room 23 after dressing out and screening for contraband. Pt. Oriented to [place] including Q15 minute rounds as well as Rover and Officer for their protection. Patient is alert and oriented, warm and dry in no acute distress. Patient denies SI, HI, and AVH. Pt. Encouraged to let me know if needs arise.  

## 2019-02-08 NOTE — ED Notes (Signed)
Hourly rounding reveals patient in room. No complaints, stable, in no acute distress. Q15 minute rounds and monitoring via Rover and Officer to continue.   

## 2019-02-08 NOTE — ED Notes (Signed)
With this nurse and EDT Beth present, pt removes jeans, black footies, blue bra & panties, hair tie, black clutch purse, black sandals, red/white mid length sleeve shirt--all placed in labeled pt belonging bag to be secured on nursing unit and pt changed into burgandy paper scrubs

## 2019-02-08 NOTE — ED Triage Notes (Signed)
Patient ambulatory to triage with steady gait, without difficulty or distress noted; pt accomp by [facility] for IVC; pt reports that her therapist put her under commitment; pt denies any SI or HI

## 2019-02-08 NOTE — Consult Note (Signed)
[facility] Psychiatry Consult   Reason for Consult: Paranoia behaviors Referring Physician: Dr. Charna Archer Patient Identification: Claire Harris MRN:  [id number] Principal Diagnosis:  Diagnosis:  Active Problems:   Paranoia ([place])   Total Time spent with patient: 1.5 hours  Subjective: "I want to go over anything about the guy.  I tried to get help today and the psychiatrist thinks I am crazy." Claire Harris is a 43 y.o. female patient presented to [facility] ED via law enforcement by way of [facility] under involuntary commitment status (IVC).  During patient assessment she was extremely emotional.  Voiced "I do not want to talk about this person anymore" (sobbing).  Patient voiced that she does not want to discuss this "Lennette Bihari" person. Patient voice "I tried talking about it and the psychiatrist said that I was crazy and he "Lennette Bihari" is not real."  The patient endorses, "I know it does not sound real but I am not making this up."  Per [facility] IVC paperwork : She has been complaining about being stalked by a rap star since 05/2018.  The IVC paperwork continues to say, what started as an online interaction with him became in the experience of being watched by him during the month of April 2020.  The patient voiced she was not to bothered by it initially.  She went on to say it became a problem and "I told him to leave.... And he is not being nice about it."  The patient endorses, "it is being a problem for her now."  The patient has no psychiatric diagnosis and has never been follow by a psychiatrist.  The patient has never been prescribed any psychiatric medication.  The patient was seen face-to-face by this provider; chart reviewed and consulted with Dr. Charna Archer on 02/08/2019 due to the care of the patient. It was discussed with the EDP that the patient does meet criteria to be admitted to the psychiatric inpatient unit.  The patient is presenting with paranoia which it is interfering with her day-to-day life.  Her  parents are extremely concerned and believed if this problem is not addressed she could be a danger to herself.  On evaluation the patient is alert and oriented x4, she is very emotional, guarded with most information concerning "Lennette Bihari" but cooperative with other questions. The patient mood-congruent with affect. The patient does not appear to be responding to internal or external stimuli.  She is presenting with some delusional thinking, but refused to elaborate more in that area. The patient denies auditory or visual hallucinations. The patient denies any suicidal, homicidal, or self-harm ideations. The patient is not presenting with any psychotic behaviors.  The patient is paranoid about being seen as "crazy."  During an encounter with the patient, she was able to answer most questions appropriately. Collateral was obtained by the patient's mother Ms. Lindie Roberson [phone] who expresses concerns for patient's paranoia behaviors.  Ms. Neace, acknowledges that her daughter has had many years of polysubstance use, incarceration due to habitual larceny. The patient has 4 children ages range 73, 31, 55 and 24 years old  wish her parents have full custody of the kids with plans to adopt them.  Mom discussed, the patient paranoia behavior has recently surfaced over the last 2 weeks.  She stated, "even when Toney Rakes was doing drugs she never presented with this type of behavior."  She voiced that the patient have a uncle that works at [employer] in [place].  The patient recently contacted her uncle  to ask him if he can help her locate "Lennette Bihari" through the [employer].  Mom is extremely concerned about the patient behavior and does not know what else to do.  Mom states they are very religious and do not believe in medications and mental illness.  She states she and the patient's dad have been praying for her but she is aware the patient does need more in-depth psychiatric care.  Plan: The  patient is a safety risk to self and does require psychiatric inpatient admission for stabilization and treatment.  HPI: Per Dr. Charna Archer; Claire Harris is a 43 y.o. female with no significant past medical history presents to the ED under IVC from [place].  Patient states that she was brought to see a therapist by police, where she thought she would be able to make a complaint against someone that is stalking her.  After she discussed the person that was talking her with the therapist, she states they did not believe her and involuntarily committed her.  Per IVC paperwork, patient with auditory hallucinations and delusions of being stalked by a "famous rapper".  She denies any SI or HI, specifically states she does not want to harm the person that stalking her, but she does have a reported history of violent behavior in the past.  She does not have any reported psychiatric history, was evaluated in the ED for similar symptoms a few days ago and discharged home.  Past Psychiatric History: History reviewed. No pertinent past psychiatric history  Risk to Self:  Yes Risk to Others:  No Prior Inpatient Therapy:  No Prior Outpatient Therapy:  Yes  Past Medical History:  Past Medical History:  Diagnosis Date  . Cervical cancer [facility])     Past Surgical History:  Procedure Laterality Date  . ABDOMINAL HYSTERECTOMY     partial/radical  . arm surgery  2012   Family History: No family history on file. Family Psychiatric  History: History reviewed. No pertinent past family psychiatric history Social History:  Social History   Substance and Sexual Activity  Alcohol Use Not Currently     Social History   Substance and Sexual Activity  Drug Use Not Currently    Social History   Socioeconomic History  . Marital status: Single    Spouse name: Not on file  . Number of children: Not on file  . Years of education: Not on file  . Highest education level: Not on file  Occupational History  . Not on  file  Social Needs  . Financial resource strain: Not on file  . Food insecurity    Worry: Not on file    Inability: Not on file  . Transportation needs    Medical: Not on file    Non-medical: Not on file  Tobacco Use  . Smoking status: Current Every Day Smoker    Packs/day: 1.00    Types: Cigarettes  . Smokeless tobacco: Never Used  Substance and Sexual Activity  . Alcohol use: Not Currently  . Drug use: Not Currently  . Sexual activity: Not on file  Lifestyle  . Physical activity    Days per week: Not on file    Minutes per session: Not on file  . Stress: Not on file  Relationships  . Social Herbalist on phone: Not on file    Gets together: Not on file    Attends religious service: Not on file    Active member of club or organization:  Not on file    Attends meetings of clubs or organizations: Not on file    Relationship status: Not on file  Other Topics Concern  . Not on file  Social History Narrative  . Not on file   Additional Social History:    Allergies:  No Known Allergies  Labs:  Results for orders placed or performed during the hospital encounter of 02/08/19 (from the past 48 hour(s))  Comprehensive metabolic panel     Status: Abnormal   Collection Time: 02/08/19  7:54 PM  Result Value Ref Range   Sodium 138 135 - 145 mmol/L   Potassium 4.4 3.5 - 5.1 mmol/L   Chloride 101 98 - 111 mmol/L   CO2 28 22 - 32 mmol/L   Glucose, Bld 102 (H) 70 - 99 mg/dL   BUN 10 6 - 20 mg/dL   Creatinine, Ser 0.58 0.44 - 1.00 mg/dL   Calcium 9.6 8.9 - 10.3 mg/dL   Total Protein 8.5 (H) 6.5 - 8.1 g/dL   Albumin 4.4 3.5 - 5.0 g/dL   AST 33 15 - 41 U/L   ALT 57 (H) 0 - 44 U/L   Alkaline Phosphatase 78 38 - 126 U/L   Total Bilirubin 0.5 0.3 - 1.2 mg/dL   GFR calc non Af Amer >60 >60 mL/min   GFR calc Af Amer >60 >60 mL/min   Anion gap 9 5 - 15    Comment: Performed at [facility], [address] [place], [place] [address]  Ethanol     Status: None    Collection Time: 02/08/19  7:54 PM  Result Value Ref Range   Alcohol, Ethyl (B) <10 <10 mg/dL    Comment: (NOTE) Lowest detectable limit for serum alcohol is 10 mg/dL. For medical purposes only. Performed at [facility], [address] [place], [place] [address]   Salicylate level     Status: None   Collection Time: 02/08/19  7:54 PM  Result Value Ref Range   Salicylate Lvl <2.3 2.8 - 30.0 mg/dL    Comment: Performed at [facility], [address] [place], [place] [address]  Acetaminophen level     Status: Abnormal   Collection Time: 02/08/19  7:54 PM  Result Value Ref Range   Acetaminophen (Tylenol), Serum <10 (L) 10 - 30 ug/mL    Comment: (NOTE) Therapeutic concentrations vary significantly. A range of 10-30 ug/mL  may be an effective concentration for many patients. However, some  are best treated at concentrations outside of this range. Acetaminophen concentrations >150 ug/mL at 4 hours after ingestion  and >50 ug/mL at 12 hours after ingestion are often associated with  toxic reactions. Performed at [facility], [address] [place], [place] [address]   cbc     Status: Abnormal   Collection Time: 02/08/19  7:54 PM  Result Value Ref Range   WBC 6.7 4.0 - 10.5 K/uL   RBC 4.75 3.87 - 5.11 MIL/uL   Hemoglobin 15.4 (H) 12.0 - 15.0 g/dL   HCT 44.7 36.0 - 46.0 %   MCV 94.1 80.0 - 100.0 fL   MCH 32.4 26.0 - 34.0 pg   MCHC 34.5 30.0 - 36.0 g/dL   RDW 14.5 11.5 - 15.5 %   Platelets 202 150 - 400 K/uL   nRBC 0.0 0.0 - 0.2 %    Comment: Performed at [facility] [address] [place], [place] [address]    No current facility-administered medications for  this encounter.    Current Outpatient Medications  Medication Sig Dispense Refill  . acetaminophen (TYLENOL) 325 MG tablet Take 650 mg by mouth every 6 (six) hours as needed for mild pain or moderate pain.    . cefUROXime (CEFTIN) 250 MG tablet Take 1 tablet (250 mg total)  by mouth 2 (two) times daily with a meal for 10 days. 20 tablet 0  . cephALEXin (KEFLEX) 500 MG capsule Take 1 capsule (500 mg total) by mouth 3 (three) times daily. (Patient not taking: Reported on 12/23/2017) 30 capsule 0  . ciprofloxacin (CIPRO) 500 MG tablet Take 1 tablet (500 mg total) by mouth 2 (two) times daily. 14 tablet 0  . docusate sodium (COLACE) 100 MG capsule Take 100 mg by mouth 3 (three) times daily as needed for mild constipation.    Marland Kitchen HYDROcodone-acetaminophen (NORCO/VICODIN) 5-325 MG tablet Take 1 tablet by mouth every 6 (six) hours as needed for severe pain. 20 tablet 0  . ibuprofen (ADVIL,MOTRIN) 800 MG tablet Take 800 mg by mouth every 8 (eight) hours as needed for moderate pain.    . metroNIDAZOLE (FLAGYL) 500 MG tablet Take 1 tablet (500 mg total) by mouth 2 (two) times daily after a meal. 14 tablet 0  . senna (SENOKOT) 8.6 MG TABS tablet Take 1 tablet by mouth 3 (three) times daily as needed for mild constipation.      Musculoskeletal: Strength & Muscle Tone: within normal limits Gait & Station: normal Patient leans: N/A  Psychiatric Specialty Exam: Physical Exam  Nursing note and vitals reviewed. Constitutional: She is oriented to person, place, and time. She appears well-developed and well-nourished.  HENT:  Head: Normocephalic.  Eyes: Pupils are equal, round, and reactive to light.  Neck: Normal range of motion. Neck supple.  Cardiovascular: Normal rate.  Respiratory: Effort normal.  Musculoskeletal: Normal range of motion.  Neurological: She is alert and oriented to person, place, and time.  Skin: Skin is warm and dry.    Review of Systems  Psychiatric/Behavioral: Positive for depression. The patient is nervous/anxious.   All other systems reviewed and are negative.   Blood pressure 116/85, pulse 77, resp. rate 18, height 5\' 9"  (1.753 m), weight 59 kg, SpO2 100 %.Body mass index is 19.2 kg/m.  General Appearance: Casual  Eye Contact:  Fair  Speech:   Clear and Coherent  Volume:  Decreased  Mood:  Anxious, Depressed and Irritable  Affect:  Congruent, Depressed, Flat, Restricted and Tearful  Thought Process:  Coherent  Orientation:  Full (Time, Place, and Person)  Thought Content:  Logical  Suicidal Thoughts:  No  Homicidal Thoughts:  No  Memory:  Immediate;   Good Recent;   Good Remote;   Good  Judgement:  Fair  Insight:  Lacking  Psychomotor Activity:  Decreased  Concentration:  Concentration: Good and Attention Span: Good  Recall:  Good  Fund of Knowledge:  Good  Language:  Good  Akathisia:  Negative  Handed:  Right  AIMS (if indicated):     Assets:  Communication Skills Desire for Improvement Social Support  ADL's:  Intact  Cognition:  WNL  Sleep:   Good     Treatment Plan Summary: Medication management and Plan Patient does meet criteria for psychiatric inpatient admission once a bed becomes available.  Disposition: Recommend psychiatric Inpatient admission when medically cleared. Supportive therapy provided about ongoing stressors.  Caroline Sauger, NP 02/08/2019 10:47 PM

## 2019-02-09 ENCOUNTER — Other Ambulatory Visit: Payer: Self-pay

## 2019-02-09 ENCOUNTER — Inpatient Hospital Stay
Admission: EM | Admit: 2019-02-09 | Discharge: 2019-02-14 | DRG: 885 | Disposition: A | Discharge status: Home / Self Care | Payer: Self-pay | Source: Intra-hospital | Attending: Psychiatry | Admitting: Psychiatry

## 2019-02-09 DIAGNOSIS — F22 Delusional disorders: Principal | ICD-10-CM | POA: Diagnosis present

## 2019-02-09 DIAGNOSIS — F29 Unspecified psychosis not due to a substance or known physiological condition: Secondary | ICD-10-CM | POA: Diagnosis present

## 2019-02-09 DIAGNOSIS — F1210 Cannabis abuse, uncomplicated: Secondary | ICD-10-CM

## 2019-02-09 DIAGNOSIS — F17210 Nicotine dependence, cigarettes, uncomplicated: Secondary | ICD-10-CM | POA: Diagnosis present

## 2019-02-09 DIAGNOSIS — Z7289 Other problems related to lifestyle: Secondary | ICD-10-CM

## 2019-02-09 DIAGNOSIS — Z20828 Contact with and (suspected) exposure to other viral communicable diseases: Secondary | ICD-10-CM | POA: Diagnosis present

## 2019-02-09 MED ORDER — HYDROXYZINE HCL 25 MG PO TABS
25.0000 mg | ORAL_TABLET | Freq: Three times a day (TID) | ORAL | Status: DC | PRN
  Administered 2019-02-09: 25 mg via ORAL
  Filled 2019-02-09 (×4): qty 1

## 2019-02-09 MED ORDER — ACETAMINOPHEN 325 MG PO TABS
650.0000 mg | ORAL_TABLET | Freq: Four times a day (QID) | ORAL | Status: DC | PRN
  Administered 2019-02-10: 650 mg via ORAL
  Filled 2019-02-09: qty 2

## 2019-02-09 MED ORDER — TRAZODONE HCL 50 MG PO TABS
50.0000 mg | ORAL_TABLET | Freq: Every evening | ORAL | Status: DC | PRN
  Administered 2019-02-09 – 2019-02-13 (×4): 50 mg via ORAL
  Filled 2019-02-09 (×6): qty 1

## 2019-02-09 MED ORDER — MAGNESIUM HYDROXIDE 400 MG/5ML PO SUSP: 30.0000 mL | Freq: Every day | ORAL | Status: DC | PRN

## 2019-02-09 MED ORDER — ALUM & MAG HYDROXIDE-SIMETH 200-200-20 MG/5ML PO SUSP: 30.0000 mL | ORAL | Status: DC | PRN

## 2019-02-09 NOTE — ED Notes (Signed)
Hourly rounding reveals patient in room. No complaints, stable, in no acute distress. Q15 minute rounds and monitoring via Rover and Officer to continue.   

## 2019-02-09 NOTE — Progress Notes (Signed)
Admission Note:   Report was received on a 43 year old female who presents IVC in no acute distress for the treatment of Psychosis (paranoia) and Anxiety. Per report, patient feels like a rapper named Lennette Bihari is following her, Patient appears anxious, and rated her anxiety a "10/10" stating that "this whole situation with this man, he will not leave me alone". Patient was calm and cooperative with admission process. Patient contracts for safety upon admission. Patient denies SI/HI/AVH and pain at this time. Patient also denies any signs/symptoms of depression to this Probation officer. Patient has no significant past medical history. Skin was assessed with Junita Push, RN and found to be clear of any abnormal marks apart from old scarring from "past opiate IV use" per patient, to her arms and face. Patient searched and no contraband found and unit policies explained and understanding verbalized. Consents obtained. Food and fluids offered, and fluids accepted. Patient had no additional questions or concerns.

## 2019-02-09 NOTE — Tx Team (Signed)
Initial Treatment Plan 02/09/2019 6:33 PM Claire Harris [id number]    PATIENT STRESSORS: Financial difficulties Other: Paranoid   PATIENT STRENGTHS: Capable of independent living Communication skills General fund of knowledge   PATIENT IDENTIFIED PROBLEMS: Paranoia  Psychosis                   DISCHARGE CRITERIA:  Ability to meet basic life and health needs Improved stabilization in mood, thinking, and/or behavior Motivation to continue treatment in a less acute level of care Reduction of life-threatening or endangering symptoms to within safe limits  PRELIMINARY DISCHARGE PLAN: Outpatient therapy Return to previous living arrangement  PATIENT/FAMILY INVOLVEMENT: This treatment plan has been presented to and reviewed with the patient, Claire Harris.The patient has been given the opportunity to ask questions and make suggestions.  Claire Helming, RN 02/09/2019, 6:33 PM

## 2019-02-09 NOTE — BH Assessment (Signed)
Patient is to be admitted to [facility] by NP J. Grandville Silos.  Attending Physician will be Dr. Weber Cooks.   Patient has been assigned to room 301, by [facility] Charge Nurse  Berneta Levins.   Intake Paper Work has been signed and placed on patient chart.  ER staff is aware of the admission:

## 2019-02-09 NOTE — ED Notes (Signed)
Pt discharged under IVC to BMU. Report given to [facility], Therapist, sports. VS stable. Belongings sent with patient.

## 2019-02-09 NOTE — Plan of Care (Signed)
New admission.   Problem: Education: Goal: Knowledge of [facility] Education information/materials will improve Outcome: Not Progressing Goal: Emotional status will improve Outcome: Not Progressing Goal: Mental status will improve Outcome: Not Progressing Goal: Verbalization of understanding the information provided will improve Outcome: Not Progressing   Problem: Self-Concept: Goal: Ability to identify factors that promote anxiety will improve Outcome: Not Progressing Goal: Level of anxiety will decrease Outcome: Not Progressing   Problem: Education: Goal: Will be free of psychotic symptoms Outcome: Not Progressing Goal: Knowledge of the prescribed therapeutic regimen will improve Outcome: Not Progressing   Problem: Health Behavior/Discharge Planning: Goal: Compliance with prescribed medication regimen will improve Outcome: Not Progressing

## 2019-02-09 NOTE — BH Assessment (Addendum)
Assessment Note  Claire Harris is an 43 y.o. female.  The pt came in after being IVC'd by [facility].  The pt reported she is being stalked by a famous rapped by the name Lennette Bihari,  She stated she met the rapper 8 months ago and thought he was a friend.  Lennette Bihari is now watching her.  She stated she isn't sure where Lennette Bihari lives, but thinks he lives in [place].  According to the IVC this information isn't true.  The pt denies any mental health treatment in the past.  She denies SI and HI.  The pt lives in her own home, but stated she has homeless people live there also.  She is currently unemployed and last worked about 2 months ago.  She denies self harm and history of abuse.  The pt reported she is on probation for habitual larceny.  She reports she is sleeping and eating well.  She reports using marijuana and last used 3 days ago.  Her UDS is pending at this time.  Pt is dressed in scrubs. She is alert and oriented x4. Pt speaks in a clear tone, at moderate volume and normal pace. Eye contact is good. Pt's mood is pleasant. Thought process is coherent and relevant. There is indication Pt is currently responding to internal stimuli or experiencing delusional thought content.?Pt was cooperative throughout assessment.    Diagnosis: F23 Brief psychotic disorder  Past Medical History:  Past Medical History:  Diagnosis Date  . Cervical cancer [facility])     Past Surgical History:  Procedure Laterality Date  . ABDOMINAL HYSTERECTOMY     partial/radical  . arm surgery  2012    Family History: No family history on file.  Social History:  reports that she has been smoking cigarettes. She has been smoking about 1.00 pack per day. She has never used smokeless tobacco. She reports previous alcohol use. She reports previous drug use.  Additional Social History:  Alcohol / Drug Use Pain Medications: See MAR Prescriptions: See MAR Over the Counter: See MAR History of alcohol / drug use?: Yes Longest period of  sobriety (when/how long): NA Substance #1 Name of Substance 1: marijuana 1 - Age of First Use: 18 1 - Amount (size/oz): one gram 1 - Frequency: daily 1 - Last Use / Amount: 02/06/2019  CIWA: CIWA-Ar BP: 116/85 Pulse Rate: 77 COWS:    Allergies: No Known Allergies  Home Medications: (Not in a hospital admission)   OB/GYN Status:  No LMP recorded. Patient has had a hysterectomy.  General Assessment Data Location of Assessment: [facility] ED TTS Assessment: In system Is this a Tele or Face-to-Face Assessment?: Face-to-Face Is this an Initial Assessment or a Re-assessment for this encounter?: Initial Assessment Patient Accompanied by:: N/A Language Other than English: No Living Arrangements: Other (Comment)(in a house) What gender do you identify as?: Female Marital status: Single Maiden name: Cech Pregnancy Status: No Living Arrangements: Non-relatives/Friends Can pt return to current living arrangement?: Yes Admission Status: Involuntary Petitioner: Other([facility]) Is patient capable of signing voluntary admission?: No Referral Source: Self/Family/Friend Insurance type: Medicaid     Crisis Care Plan Living Arrangements: Non-relatives/Friends Legal Guardian: Other:(Self) Name of Psychiatrist: none Name of Therapist: none  Education Status Is patient currently in school?: No Is the patient employed, unemployed or receiving disability?: Unemployed  Risk to self with the past 6 months Suicidal Ideation: No Has patient been a risk to self within the past 6 months prior to admission? : No Suicidal Intent: No Has  patient had any suicidal intent within the past 6 months prior to admission? : No Is patient at risk for suicide?: No Suicidal Plan?: No Has patient had any suicidal plan within the past 6 months prior to admission? : No Access to Means: No What has been your use of drugs/alcohol within the last 12 months?: marijuana use Previous Attempts/Gestures: No How many  times?: 0 Other Self Harm Risks: none Triggers for Past Attempts: None known Intentional Self Injurious Behavior: None Family Suicide History: No Recent stressful life event(s): Other (Comment)(pt reported she is being stalked) Persecutory voices/beliefs?: No Depression: No Substance abuse history and/or treatment for substance abuse?: Yes Suicide prevention information given to non-admitted patients: Not applicable  Risk to Others within the past 6 months Homicidal Ideation: No Does patient have any lifetime risk of violence toward others beyond the six months prior to admission? : No Thoughts of Harm to Others: No Current Homicidal Intent: No Current Homicidal Plan: No Access to Homicidal Means: No Identified Victim: pt denies History of harm to others?: No Assessment of Violence: None Noted Violent Behavior Description: pt denies Does patient have access to weapons?: No Criminal Charges Pending?: No Does patient have a court date: No Is patient on probation?: Yes  Psychosis Hallucinations: None noted Delusions: Persecutory  Mental Status Report Appearance/Hygiene: In hospital gown, In scrubs Eye Contact: Good Motor Activity: Freedom of movement, Unremarkable Speech: Logical/coherent Level of Consciousness: Alert Mood: Pleasant Affect: Appropriate to circumstance Anxiety Level: None Thought Processes: Coherent, Relevant Judgement: Impaired Orientation: Person, Place, Time, Situation Obsessive Compulsive Thoughts/Behaviors: None  Cognitive Functioning Concentration: Normal Memory: Recent Intact, Remote Intact Is patient IDD: No Insight: Poor Impulse Control: Poor Appetite: Good Have you had any weight changes? : No Change Sleep: No Change Total Hours of Sleep: 8 Vegetative Symptoms: None  ADLScreening [facility] Assessment Services) Patient's cognitive ability adequate to safely complete daily activities?: Yes Patient able to express need for assistance with  ADLs?: Yes Independently performs ADLs?: Yes (appropriate for developmental age)  Prior Inpatient Therapy Prior Inpatient Therapy: No  Prior Outpatient Therapy Prior Outpatient Therapy: No Does patient have an ACCT team?: No Does patient have Intensive In-House Services?  : No Does patient have [facility] services? : No Does patient have P4CC services?: No  ADL Screening (condition at time of admission) Patient's cognitive ability adequate to safely complete daily activities?: Yes Patient able to express need for assistance with ADLs?: Yes Independently performs ADLs?: Yes (appropriate for developmental age)       Abuse/Neglect Assessment (Assessment to be complete while patient is alone) Abuse/Neglect Assessment Can Be Completed: Yes Physical Abuse: Denies Verbal Abuse: Denies Sexual Abuse: Denies Exploitation of patient/patient's resources: Denies Self-Neglect: Denies Values / Beliefs Cultural Requests During Hospitalization: None Spiritual Requests During Hospitalization: None Consults Spiritual Care Consult Needed: No Social Work Consult Needed: No Regulatory affairs officer (For Healthcare) Does Patient Have a Medical Advance Directive?: No Would patient like information on creating a medical advance directive?: No - Patient declined          Disposition:  Disposition Initial Assessment Completed for this Encounter: Yes  NP J. Grandville Silos recommends inpatient treatment.  On Site Evaluation by:   Reviewed with Physician:    Enzo Montgomery 02/09/2019 3:22 AM

## 2019-02-10 DIAGNOSIS — F1210 Cannabis abuse, uncomplicated: Secondary | ICD-10-CM

## 2019-02-10 DIAGNOSIS — F29 Unspecified psychosis not due to a substance or known physiological condition: Secondary | ICD-10-CM

## 2019-02-10 DIAGNOSIS — F22 Delusional disorders: Principal | ICD-10-CM

## 2019-02-10 MED ORDER — RISPERIDONE 1 MG PO TBDP
1.0000 mg | ORAL_TABLET | Freq: Every day | ORAL | Status: DC
  Administered 2019-02-10 – 2019-02-12 (×3): 1 mg via ORAL
  Filled 2019-02-10 (×3): qty 1

## 2019-02-10 NOTE — [facility] Suicide Risk Assessment (Signed)
[place] INPATIENT:  Family/Significant Other Suicide Prevention Education  Suicide Prevention Education:  Education Completed; Rudy, [place], father, [phone]   has been identified by the patient as the family member/significant other with whom the patient will be residing, and identified as the person(s) who will aid the patient in the event of a mental health crisis (suicidal ideations/suicide attempt).  With written consent from the patient, the family member/significant other has been provided the following suicide prevention education, prior to the and/or following the discharge of the patient.  The suicide prevention education provided includes the following:  Suicide risk factors  Suicide prevention and interventions  National Suicide Hotline telephone number  [facility] assessment telephone number  [facility] Emergency Assistance [place] and/or Residential Mobile Crisis Unit telephone number  Request made of family/significant other to:  Remove weapons (e.g., guns, rifles, knives), all items previously/currently identified as safety concern.    Remove drugs/medications (over-the-counter, prescriptions, illicit drugs), all items previously/currently identified as a safety concern.  The family member/significant other verbalizes understanding of the suicide prevention education information provided.  The family member/significant other agrees to remove the items of safety concern listed above.  Father reports "she has been telling me for some time that she has been followed by some rapper named Marylee Floras". Father reports "she also has told me that she has recorded people talking in her home.  I've listened to the recordings and I never hear anything but normal house noises."  CSW asked if it could be the homeless people that patient allegedly lets live with her.  Father reports that he does not think it is the homeless people and reports that he is  aware that she lets homeless people live with her.  Father also states that he has never been in the patient's home and has never seen the homeless people the patient allows to stay with her.  Father reports "she tells me that she communicates with this Lennette Bihari mentally".  Father reports no concerns for weapons.  Father reports that he does not think this is substance related "as she is speaking too clearly".    Rozann Lesches 02/10/2019, 11:39 AM

## 2019-02-10 NOTE — Progress Notes (Signed)
Patient alert and oriented x 4, affect is flat but she brighten upon approach, patient's thoughts is organized and coherent, no distress noted interacting appropriately with peers and staff. Patient denies paranoia, and appears less anxious rated anxiety a 5/10. Patient denies SI/HI/AVH, emotional support given to patient, compliant with medication regimen, 15 minutes safety checks maintained will continue to monitor.

## 2019-02-10 NOTE — Progress Notes (Signed)
Recreation Therapy Notes  INPATIENT RECREATION THERAPY ASSESSMENT  Patient Details Name: Anique Beckley MRN: [id number] DOB: 1975/12/27 Today's Date: 02/10/2019       Information Obtained From: Patient  Able to Participate in Assessment/Interview: Yes  Patient Presentation: Responsive  Reason for Admission (Per Patient): Active Symptoms  Patient Stressors:    Coping Skills:   Other (Comment)(None)  Leisure Interests (2+):  Music - Listen, Games - Music therapist, Games - Cross-word  Frequency of Recreation/Participation: Biomedical engineer of Community Resources:     [employer]:     Current Use:    If no, Barriers?:    Expressed Interest in [place]idence:  Insurance underwriter  Patient Main Form of Transportation: Other (Comment)(Friends)  Patient Strengths:  Good heart  Patient Identified Areas of Improvement:  Learning to isolate  Patient Goal for Hospitalization:  To leave  Current SI (including self-harm):  No  Current HI:  No  Current AVH: No  Staff Intervention Plan: Group Attendance, Collaborate with Interdisciplinary Treatment Team  Consent to Intern Participation: N/A  Clarence Dunsmore 02/10/2019, 3:34 PM

## 2019-02-10 NOTE — H&P (Signed)
Psychiatric Admission Assessment Adult  Patient Identification: Jaquia Benedicto MRN:  [id number] Date of Evaluation:  02/10/2019 Chief Complaint:  psychosis Principal Diagnosis: Psychosis ([place]) Diagnosis:  Principal Problem:   Psychosis ([place]) Active Problems:   Cannabis abuse  History of Present Illness: Patient seen and chart reviewed.  Patient brought to the emergency room from [place] where she was taken by police for an evaluation.  Patient is very guarded and unwilling to share details but she did discuss the basic situation.  Patient was involved with a man she supposedly met on the Internet whom she refers to as "Lennette Bihari".  At some point recently she decided to end the relationship but since then believes that he has been communicating with her influencing her and somehow transmitting information to her.  She says it is very hard to explain exactly what she is experiencing.  She told people in the emergency room that he had satellites that were communicating with her.  She tells me about how she is hearing voices at various places inside her home which she believes he is using technology to transmit at her.  She is unable to speculate about any particular motive to this.  She is clearly disturbed by this.  Denies however being depressed denies suicidal thoughts denies homicidal thoughts.  Denies sleep or appetite problems.  Not currently getting any psychiatric treatment.  Admits to use of marijuana but denies any other drugs recently.  Family when contacted have been aware of her paranoia and odd thinking.  They are also concerned because she is reportedly letting homeless people live inside her house although the details of that are unclear. Associated Signs/Symptoms: Depression Symptoms:  difficulty concentrating, (Hypo) Manic Symptoms:  Distractibility, Hallucinations, Anxiety Symptoms:  Nonspecific Psychotic Symptoms:  Hallucinations: Auditory Ideas of Reference, Paranoia, PTSD  Symptoms: Negative Total Time spent with patient: 1 hour  Past Psychiatric History: Patient claims she has never had any kind of psychiatric treatment in the past other than seeing a Suboxone provider.  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  Was able to get into a Suboxone program but even with that continued narcotic abuse.  Eventually went to prison.  Says that since she has been out of prison she has not relapsed onto drugs.  Denies any history of suicide attempts or violence.  Denies ever being on psychiatric medicine in the past.  She was evaluated in our emergency room just a couple days prior to this admission for the same exact symptoms but at that time was felt to not require hospitalization  Is the patient at risk to self? Yes.    Has the patient been a risk to self in the past 6 months? No.  Has the patient been a risk to self within the distant past? No.  Is the patient a risk to others? No.  Has the patient been a risk to others in the past 6 months? No.  Has the patient been a risk to others within the distant past? No.   Prior Inpatient Therapy:   Prior Outpatient Therapy:    Alcohol Screening: 1. How often do you have a drink containing alcohol?: Never 2. How many drinks containing alcohol do you have on a typical day when you are drinking?: 1 or 2 3. How often do you have six or more drinks on one occasion?: Never AUDIT-C Score: 0 4. How often during the last year have you found that you were not able  to stop drinking once you had started?: Never 5. How often during the last year have you failed to do what was normally expected from you becasue of drinking?: Never 6. How often during the last year have you needed a first drink in the morning to get yourself going after a heavy drinking session?: Never 7. How often during the last year have you had a feeling of guilt of remorse after drinking?: Never 8. How often during the last year have you  been unable to remember what happened the night before because you had been drinking?: Never 9. Have you or someone else been injured as a result of your drinking?: No 10. Has a relative or friend or a doctor or another health worker been concerned about your drinking or suggested you cut down?: No Alcohol Use Disorder Identification Test Final Score (AUDIT): 0 Alcohol Brief Interventions/Follow-up: AUDIT Score <7 follow-up not indicated Substance Abuse History in the last 12 months:  Yes.   Consequences of Substance Abuse: Medical Consequences:  Possible worsening or contribution to psychotic symptoms Previous Psychotropic Medications: No  Psychological Evaluations: No  Past Medical History:  Past Medical History:  Diagnosis Date  . Cervical cancer [facility])     Past Surgical History:  Procedure Laterality Date  . ABDOMINAL HYSTERECTOMY     partial/radical  . arm surgery  2012   Family History: History reviewed. No pertinent family history. Family Psychiatric  History: Denies any family history of psychiatric problems Tobacco Screening: Have you used any form of tobacco in the last 30 days? (Cigarettes, Smokeless Tobacco, Cigars, and/or Pipes): Yes Tobacco use, Select all that apply: 5 or more cigarettes per day Are you interested in Tobacco Cessation Medications?: Yes, will notify MD for an order Counseled patient on smoking cessation including recognizing danger situations, developing coping skills and basic information about quitting provided: Refused/Declined practical counseling Social History:  Social History   Substance and Sexual Activity  Alcohol Use Not Currently     Social History   Substance and Sexual Activity  Drug Use Not Currently    Additional Social History: Marital status: Single Are you sexually active?: No What is your sexual orientation?: Hetersexual Has your sexual activity been affected by drugs, alcohol, medication, or emotional stress?: Pt  denies. Does patient have children?: Yes How many children?: 4 How is patient's relationship with their children?: Pt reports that she has 4 children (24, 58, 73, 5),  Pt reports that her parents have custody of her children.  "I just got out of jail 2 years ago and I am trying to get myself together." Pt reports.    Pain Medications: see PTA Prescriptions: see PTA Over the Counter: see PTA History of alcohol / drug use?: Yes                    Allergies:  No Known Allergies Lab Results:  Results for orders placed or performed during the hospital encounter of 02/08/19 (from the past 48 hour(s))  Comprehensive metabolic panel     Status: Abnormal   Collection Time: 02/08/19  7:54 PM  Result Value Ref Range   Sodium 138 135 - 145 mmol/L   Potassium 4.4 3.5 - 5.1 mmol/L   Chloride 101 98 - 111 mmol/L   CO2 28 22 - 32 mmol/L   Glucose, Bld 102 (H) 70 - 99 mg/dL   BUN 10 6 - 20 mg/dL   Creatinine, Ser 0.58 0.44 - 1.00 mg/dL   Calcium  9.6 8.9 - 10.3 mg/dL   Total Protein 8.5 (H) 6.5 - 8.1 g/dL   Albumin 4.4 3.5 - 5.0 g/dL   AST 33 15 - 41 U/L   ALT 57 (H) 0 - 44 U/L   Alkaline Phosphatase 78 38 - 126 U/L   Total Bilirubin 0.5 0.3 - 1.2 mg/dL   GFR calc non Af Amer >60 >60 mL/min   GFR calc Af Amer >60 >60 mL/min   Anion gap 9 5 - 15    Comment: Performed at [facility], [address], [place], [place] [address]  Ethanol     Status: None   Collection Time: 02/08/19  7:54 PM  Result Value Ref Range   Alcohol, Ethyl (B) <10 <10 mg/dL    Comment: (NOTE) Lowest detectable limit for serum alcohol is 10 mg/dL. For medical purposes only. Performed at [facility], [address] [place], [place] [address]   Salicylate level     Status: None   Collection Time: 02/08/19  7:54 PM  Result Value Ref Range   Salicylate Lvl <4.6 2.8 - 30.0 mg/dL    Comment: Performed at [facility], [address] [place], [place] [address]  Acetaminophen level      Status: Abnormal   Collection Time: 02/08/19  7:54 PM  Result Value Ref Range   Acetaminophen (Tylenol), Serum <10 (L) 10 - 30 ug/mL    Comment: (NOTE) Therapeutic concentrations vary significantly. A range of 10-30 ug/mL  may be an effective concentration for many patients. However, some  are best treated at concentrations outside of this range. Acetaminophen concentrations >150 ug/mL at 4 hours after ingestion  and >50 ug/mL at 12 hours after ingestion are often associated with  toxic reactions. Performed at [facility], [address] [place], [place] [address]   cbc     Status: Abnormal   Collection Time: 02/08/19  7:54 PM  Result Value Ref Range   WBC 6.7 4.0 - 10.5 K/uL   RBC 4.75 3.87 - 5.11 MIL/uL   Hemoglobin 15.4 (H) 12.0 - 15.0 g/dL   HCT 44.7 36.0 - 46.0 %   MCV 94.1 80.0 - 100.0 fL   MCH 32.4 26.0 - 34.0 pg   MCHC 34.5 30.0 - 36.0 g/dL   RDW 14.5 11.5 - 15.5 %   Platelets 202 150 - 400 K/uL   nRBC 0.0 0.0 - 0.2 %    Comment: Performed at [facility], [address] [place], [place] [address]  SARS Coronavirus 2 [facility] order, Performed in [facility] hospital lab) Nasopharyngeal Nasopharyngeal Swab     Status: None   Collection Time: 02/08/19 10:41 PM   Specimen: Nasopharyngeal Swab  Result Value Ref Range   SARS Coronavirus 2 NEGATIVE NEGATIVE    Comment: (NOTE) If result is NEGATIVE SARS-CoV-2 target nucleic acids are NOT DETECTED. The SARS-CoV-2 RNA is generally detectable in upper and lower  respiratory specimens during the acute phase of infection. The lowest  concentration of SARS-CoV-2 viral copies this assay can detect is 250  copies / mL. A negative result does not preclude SARS-CoV-2 infection  and should not be used as the sole basis for treatment or other  patient management decisions.  A negative result may occur with  improper specimen collection / handling, submission of specimen other  than nasopharyngeal swab,  presence of viral mutation(s) within the  areas targeted by this assay, and inadequate number of viral copies  (<250 copies /  mL). A negative result must be combined with clinical  observations, patient history, and epidemiological information. If result is POSITIVE SARS-CoV-2 target nucleic acids are DETECTED. The SARS-CoV-2 RNA is generally detectable in upper and lower  respiratory specimens dur ing the acute phase of infection.  Positive  results are indicative of active infection with SARS-CoV-2.  Clinical  correlation with patient history and other diagnostic information is  necessary to determine patient infection status.  Positive results do  not rule out bacterial infection or co-infection with other viruses. If result is PRESUMPTIVE POSTIVE SARS-CoV-2 nucleic acids MAY BE PRESENT.   A presumptive positive result was obtained on the submitted specimen  and confirmed on repeat testing.  While 2019 novel coronavirus  (SARS-CoV-2) nucleic acids may be present in the submitted sample  additional confirmatory testing may be necessary for epidemiological  and / or clinical management purposes  to differentiate between  SARS-CoV-2 and other Sarbecovirus currently known to infect humans.  If clinically indicated additional testing with an alternate test  methodology [phone]) is advised. The SARS-CoV-2 RNA is generally  detectable in upper and lower respiratory sp ecimens during the acute  phase of infection. The expected result is Negative. Fact Sheet for Patients:  StrictlyIdeas.no Fact Sheet for Healthcare Providers: BankingDealers.co.za This test is not yet approved or cleared by the [place] FDA and has been authorized for detection and/or diagnosis of SARS-CoV-2 by FDA under an Emergency Use Authorization (EUA).  This EUA will remain in effect (meaning this test can be used) for the duration of the COVID-19 declaration under  Section 564(b)(1) of the Act, 21 U.S.C. section 360bbb-3(b)(1), unless the authorization is terminated or revoked sooner. Performed at [facility], [address] [place], [place] [address]     Blood Alcohol level:  Lab Results  Component Value Date   [facility] <10 02/08/2019   ETH <10 95/02/3266    Metabolic Disorder Labs:  No results found for: HGBA1C, MPG No results found for: PROLACTIN No results found for: CHOL, TRIG, HDL, CHOLHDL, VLDL, LDLCALC  Current Medications: Current Facility-Administered Medications  Medication Dose Route Frequency Provider Last Rate Last Dose  . acetaminophen (TYLENOL) tablet 650 mg  650 mg Oral Q6H PRN Money, Lowry Ram, FNP      . alum & mag hydroxide-simeth (MAALOX/MYLANTA) 200-200-20 MG/5ML suspension 30 mL  30 mL Oral Q4H PRN Money, Lowry Ram, FNP      . hydrOXYzine (ATARAX/VISTARIL) tablet 25 mg  25 mg Oral TID PRN Money, Lowry Ram, FNP   25 mg at 02/09/19 2151  . magnesium hydroxide (MILK OF MAGNESIA) suspension 30 mL  30 mL Oral Daily PRN Money, Lowry Ram, FNP      . risperiDONE (RISPERDAL M-TABS) disintegrating tablet 1 mg  1 mg Oral QHS Yalitza Teed T, MD      . traZODone (DESYREL) tablet 50 mg  50 mg Oral QHS PRN Money, Lowry Ram, FNP   50 mg at 02/09/19 2151   PTA Medications: Medications Prior to Admission  Medication Sig Dispense Refill Last Dose  . acetaminophen (TYLENOL) 325 MG tablet Take 650 mg by mouth every 6 (six) hours as needed for mild pain or moderate pain.     . cefUROXime (CEFTIN) 250 MG tablet Take 1 tablet (250 mg total) by mouth 2 (two) times daily with a meal for 10 days. 20 tablet 0   . cephALEXin (KEFLEX) 500 MG capsule Take 1 capsule (500 mg total) by mouth 3 (three) times daily. (  Patient not taking: Reported on 12/23/2017) 30 capsule 0   . ciprofloxacin (CIPRO) 500 MG tablet Take 1 tablet (500 mg total) by mouth 2 (two) times daily. 14 tablet 0   . docusate sodium (COLACE) 100 MG capsule Take 100 mg by mouth 3  (three) times daily as needed for mild constipation.     Marland Kitchen HYDROcodone-acetaminophen (NORCO/VICODIN) 5-325 MG tablet Take 1 tablet by mouth every 6 (six) hours as needed for severe pain. 20 tablet 0   . ibuprofen (ADVIL,MOTRIN) 800 MG tablet Take 800 mg by mouth every 8 (eight) hours as needed for moderate pain.     . metroNIDAZOLE (FLAGYL) 500 MG tablet Take 1 tablet (500 mg total) by mouth 2 (two) times daily after a meal. 14 tablet 0   . senna (SENOKOT) 8.6 MG TABS tablet Take 1 tablet by mouth 3 (three) times daily as needed for mild constipation.       Musculoskeletal: Strength & Muscle Tone: within normal limits Gait & Station: normal Patient leans: N/A  Psychiatric Specialty Exam: Physical Exam  Constitutional: She appears well-developed and well-nourished.  HENT:  Head: Normocephalic and atraumatic.  Eyes: Pupils are equal, round, and reactive to light. Conjunctivae are normal.  Neck: Normal range of motion.  Cardiovascular: Normal heart sounds.  Respiratory: Effort normal.  GI: Soft.  Musculoskeletal: Normal range of motion.  Neurological: She is alert.  Skin: Skin is warm and dry.  Psychiatric: Her affect is blunt. Her speech is delayed and tangential. She is slowed. She is not agitated and not aggressive. Thought content is paranoid. Cognition and memory are impaired. She expresses inappropriate judgment. She expresses no homicidal and no suicidal ideation.    Review of Systems  Constitutional: Negative.   HENT: Negative.   Eyes: Negative.   Respiratory: Negative.   Cardiovascular: Negative.   Gastrointestinal: Negative.   Musculoskeletal: Negative.   Skin: Negative.   Neurological: Negative.   Psychiatric/Behavioral: Positive for hallucinations and substance abuse. Negative for depression, memory loss and suicidal ideas. The patient is nervous/anxious. The patient does not have insomnia.     Blood pressure (!) 83/56, pulse (!) 54, temperature 97.6 F (36.4 C),  temperature source Oral, resp. rate 18, height '5\' 9"'  (1.753 m), weight 59.4 kg, SpO2 93 %.Body mass index is 19.35 kg/m.  General Appearance: Casual  Eye Contact:  Fair  Speech:  Clear and Coherent  Volume:  Decreased  Mood:  Anxious  Affect:  Constricted  Thought Process:  Disorganized  Orientation:  Full (Time, Place, and Person)  Thought Content:  Illogical, Hallucinations: Auditory, Ideas of Reference:   Paranoia and Paranoid Ideation  Suicidal Thoughts:  No  Homicidal Thoughts:  No  Memory:  Immediate;   Fair Recent;   Fair Remote;   Fair  Judgement:  Impaired  Insight:  Shallow  Psychomotor Activity:  Restlessness  Concentration:  Concentration: Poor  Recall:  [employer] of Knowledge:  Fair  Language:  Fair  Akathisia:  No  Handed:  Right  AIMS (if indicated):     Assets:  Desire for Improvement Housing Physical Health Resilience Social Support  ADL's:  Intact  Cognition:  Impaired,  Mild  Sleep:  Number of Hours: 6.25    Treatment Plan Summary: Daily contact with patient to assess and evaluate symptoms and progress in treatment, Medication management and Plan Patient with psychotic symptoms including auditory hallucinations paranoia ideas of reference.  Unclear how long this is been going on probably a couple months  or so.  Patient has a long history of drug abuse but claims that she has been staying sober other than marijuana use.  Drug screen shows nothing except marijuana.  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  She is of course rather offended that we do not believe in her symptoms but is not threatening.  Plan will be to start low-dose antipsychotic of Risperdal at nighttime.  Continue monitoring and evaluation.  Unclear whether she really represents a definite danger at this point although it does sound like she is not doing the best job taking  care of herself.  Reassess daily for need of hospitalization.  Work on referral for outpatient treatment ultimately  Observation Level/Precautions:  15 minute checks  Laboratory:  UDS  Psychotherapy:    Medications:    Consultations:    Discharge Concerns:    Estimated LOS:  Other:     Physician Treatment Plan for Primary Diagnosis: Psychosis ([place]) Long Term Goal(s): Improvement in symptoms so as ready for discharge  Short Term Goals: Ability to verbalize feelings will improve and Ability to demonstrate self-control will improve  Physician Treatment Plan for Secondary Diagnosis: Principal Problem:   Psychosis ([place]) Active Problems:   Cannabis abuse  Long Term Goal(s): Improvement in symptoms so as ready for discharge  Short Term Goals: Compliance with prescribed medications will improve  I certify that inpatient services furnished can reasonably be expected to improve the patient's condition.    Alethia Berthold, MD 8/14/20204:59 PM

## 2019-02-10 NOTE — Tx Team (Addendum)
Interdisciplinary Treatment and Diagnostic Plan Update  02/10/2019 Time of Session: 2:30pm Claire Harris MRN: [id number]  Principal Diagnosis: <principal problem not specified>  Secondary Diagnoses: Active Problems:   Psychosis ([place])   Current Medications:  Current Facility-Administered Medications  Medication Dose Route Frequency Provider Last Rate Last Dose  . acetaminophen (TYLENOL) tablet 650 mg  650 mg Oral Q6H PRN Money, Lowry Ram, FNP      . alum & mag hydroxide-simeth (MAALOX/MYLANTA) 200-200-20 MG/5ML suspension 30 mL  30 mL Oral Q4H PRN Money, Lowry Ram, FNP      . hydrOXYzine (ATARAX/VISTARIL) tablet 25 mg  25 mg Oral TID PRN Money, Lowry Ram, FNP   25 mg at 02/09/19 2151  . magnesium hydroxide (MILK OF MAGNESIA) suspension 30 mL  30 mL Oral Daily PRN Money, Lowry Ram, FNP      . risperiDONE (RISPERDAL M-TABS) disintegrating tablet 1 mg  1 mg Oral QHS Clapacs, John T, MD      . traZODone (DESYREL) tablet 50 mg  50 mg Oral QHS PRN Money, Lowry Ram, FNP   50 mg at 02/09/19 2151   PTA Medications: Medications Prior to Admission  Medication Sig Dispense Refill Last Dose  . acetaminophen (TYLENOL) 325 MG tablet Take 650 mg by mouth every 6 (six) hours as needed for mild pain or moderate pain.     . cefUROXime (CEFTIN) 250 MG tablet Take 1 tablet (250 mg total) by mouth 2 (two) times daily with a meal for 10 days. 20 tablet 0   . cephALEXin (KEFLEX) 500 MG capsule Take 1 capsule (500 mg total) by mouth 3 (three) times daily. (Patient not taking: Reported on 12/23/2017) 30 capsule 0   . ciprofloxacin (CIPRO) 500 MG tablet Take 1 tablet (500 mg total) by mouth 2 (two) times daily. 14 tablet 0   . docusate sodium (COLACE) 100 MG capsule Take 100 mg by mouth 3 (three) times daily as needed for mild constipation.     Marland Kitchen HYDROcodone-acetaminophen (NORCO/VICODIN) 5-325 MG tablet Take 1 tablet by mouth every 6 (six) hours as needed for severe pain. 20 tablet 0   . ibuprofen (ADVIL,MOTRIN) 800 MG  tablet Take 800 mg by mouth every 8 (eight) hours as needed for moderate pain.     . metroNIDAZOLE (FLAGYL) 500 MG tablet Take 1 tablet (500 mg total) by mouth 2 (two) times daily after a meal. 14 tablet 0   . senna (SENOKOT) 8.6 MG TABS tablet Take 1 tablet by mouth 3 (three) times daily as needed for mild constipation.       Patient Stressors: Financial difficulties Other: Paranoid  Patient Strengths: Capable of independent living Curator fund of knowledge  Treatment Modalities: Medication Management, Group therapy, Case management,  1 to 1 session with clinician, Psychoeducation, Recreational therapy.   Physician Treatment Plan for Primary Diagnosis: <principal problem not specified> Long Term Goal(s):     Short Term Goals:    Medication Management: Evaluate patient's response, side effects, and tolerance of medication regimen.  Therapeutic Interventions: 1 to 1 sessions, Unit Group sessions and Medication administration.  Evaluation of Outcomes: Progressing  Physician Treatment Plan for Secondary Diagnosis: Active Problems:   Psychosis ([place])  Long Term Goal(s):     Short Term Goals:       Medication Management: Evaluate patient's response, side effects, and tolerance of medication regimen.  Therapeutic Interventions: 1 to 1 sessions, Unit Group sessions and Medication administration.  Evaluation of Outcomes: Progressing   RN Treatment  Plan for Primary Diagnosis: <principal problem not specified> Long Term Goal(s): Knowledge of disease and therapeutic regimen to maintain health will improve  Short Term Goals: Ability to verbalize frustration and anger appropriately will improve, Ability to demonstrate self-control, Ability to participate in decision making will improve, Ability to verbalize feelings will improve, Ability to disclose and discuss suicidal ideas, Ability to identify and develop effective coping behaviors will improve and Compliance  with prescribed medications will improve  Medication Management: RN will administer medications as ordered by provider, will assess and evaluate patient's response and provide education to patient for prescribed medication. RN will report any adverse and/or side effects to prescribing provider.  Therapeutic Interventions: 1 on 1 counseling sessions, Psychoeducation, Medication administration, Evaluate responses to treatment, Monitor vital signs and CBGs as ordered, Perform/monitor CIWA, COWS, AIMS and Fall Risk screenings as ordered, Perform wound care treatments as ordered.  Evaluation of Outcomes: Progressing   LCSW Treatment Plan for Primary Diagnosis: <principal problem not specified> Long Term Goal(s): Safe transition to appropriate next level of care at discharge, Engage patient in therapeutic group addressing interpersonal concerns.  Short Term Goals: Engage patient in aftercare planning with referrals and resources, Increase social support, Increase ability to appropriately verbalize feelings, Increase emotional regulation and Facilitate acceptance of mental health diagnosis and concerns  Therapeutic Interventions: Assess for all discharge needs, 1 to 1 time with Social worker, Explore available resources and support systems, Assess for adequacy in community support network, Educate family and significant other(s) on suicide prevention, Complete Psychosocial Assessment, Interpersonal group therapy.  Evaluation of Outcomes: Progressing   Progress in Treatment: Attending groups: No. Participating in groups: No. Taking medication as prescribed: Yes. Toleration medication: Yes. Family/Significant other contact made: Yes, individual(s) contacted:  SPE completed with patient's father.  Patient understands diagnosis: Yes. Discussing patient identified problems/goals with staff: Yes. Medical problems stabilized or resolved: Yes. Denies suicidal/homicidal ideation: Yes. Issues/concerns  per patient self-inventory: No. Other: none  New problem(s) identified: No, Describe:  none  New Short Term/Long Term Goal(s):  elimination of symptoms of psychosis, medication management for mood stabilization; elimination of SI thoughts; development of comprehensive mental wellness/sobriety plan.  Patient Goals:  "to leave"  Discharge Plan or Barriers: Patient reports plans to return to her home.  Patient declined for CSW to make a referral for aftercare at this time.   Reason for Continuation of Hospitalization: Anxiety Depression Medical Issues Medication stabilization  Estimated Length of Stay: TBD  Recreational Therapy: Patient: N/A Patient Goal: Patient will engage in groups without prompting or encouragement from LRT x3 group sessions within 5 recreation therapy group sessions  Attendees: Patient: Claire Harris 02/10/2019 3:12 PM  Physician: Dr. Weber Cooks, MD 02/10/2019 3:12 PM  Nursing: West Pugh, RN 02/10/2019 3:12 PM  RN Care Manager: 02/10/2019 3:12 PM  Social Worker: Assunta Curtis, LCSW 02/10/2019 3:12 PM  Recreational Therapist: Roanna Epley, CTRS, LRT 02/10/2019 3:12 PM  Other: Sanjuana Kava, LCSW 02/10/2019 3:12 PM  Other:  02/10/2019 3:12 PM  Other: 02/10/2019 3:12 PM    Scribe for Treatment Team: Rozann Lesches, LCSW 02/10/2019 3:12 PM

## 2019-02-10 NOTE — BHH Suicide Risk Assessment (Signed)
[facility] INPATIENT:  Family/Significant Other Suicide Prevention Education  Suicide Prevention Education:  Contact Attempts: Nat[place], [place], father, [phone]  has been identified by the patient as the family member/significant other with whom the patient will be residing, and identified as the person(s) who will aid the patient in the event of a mental health crisis.  With written consent from the patient, two attempts were made to provide suicide prevention education, prior to and/or following the patient's discharge.  We were unsuccessful in providing suicide prevention education.  A suicide education pamphlet was given to the patient to share with family/significant other.  Date and time of first attempt: 02/10/2019 Date and time of second attempt: Second attempt is needed  Rozann Lesches 02/10/2019, 10:59 AM
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
## 2019-02-10 NOTE — BHH Group Notes (Signed)

## 2019-02-10 NOTE — Plan of Care (Signed)
Patient stayed in bed most of the shift stated that she need to sleep.Pleasant and cooperative on approach.Denies SI,HI and AVH.Appetite good.Support and encouragement given.

## 2019-02-10 NOTE — Progress Notes (Signed)
Recreation Therapy Notes   Date: 02/10/2019  Time: 9:30 am   Location: Craft room   Behavioral response: N/A   Intervention Topic: Decision-Making   Discussion/Intervention: Patient did not attend group.   Clinical Observations/Feedback:  Patient did not attend group.   Arlicia Paquette LRT/CTRS        Shenandoah Yeats 02/10/2019 10:45 AM
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
## 2019-02-11 NOTE — Progress Notes (Signed)
D: Patient remains isolative to her room and to self. Observed ambulating the halls to go get her meals and water from the community room. States that her sleep  last night was good with the use of a sleep aid. Reports that her appetite is good with normal energy and good concentration. Rates her depression a 4 and anxiety is rated at a 6. Denies pain, SI/HI/AVH at this time. Her goal for today is to abide by regulations and rules.  A: Encouraged patient participation with unit programming. Education provided on medication and treatment plans. Patient given support and encouragement to be proactive with her treatment plan.   R:  Patient continues to be monitored Q 15 minutes for safety per unit protocol. Patient remains safe on the unit

## 2019-02-11 NOTE — Progress Notes (Signed)
[facility] Psychiatric MD Progress Note  02/11/2019 10:53 AM Lakeia Bradshaw  MRN:  [id number]   Ms Krinsky is a 43yo F with a h/o substance use, who was admitted to [facility] unit yesterday due to psychosis. Patient seen.  Chart reviewed. Patient discussed with nursing; no overnight events reported. Subjective:   Patient reports "I slept wonderful". She reports feeling "better" after started on a "new medication". She reports feeling "good" and "safe". She denies suicidal or homicidal thoughts, denies any hallucinations. Denies side effects from medications. She keeps saying that she is "safe" and "slept very well" and does not express any delusions during the interview. She reports feeling "confused, like I still sleeping".  Principal Problem: Psychosis ([place]) Diagnosis: Principal Problem:   Psychosis ([place]) Active Problems:   Cannabis abuse  Total Time spent with patient: 15 minutes  Past Psychiatric History: see H&P  Past Medical History:  Past Medical History:  Diagnosis Date  . Cervical cancer [facility])     Past Surgical History:  Procedure Laterality Date  . ABDOMINAL HYSTERECTOMY     partial/radical  . arm surgery  2012   Family History: History reviewed. No pertinent family history. Family Psychiatric  History:see H&P Social History:  Social History   Substance and Sexual Activity  Alcohol Use Not Currently     Social History   Substance and Sexual Activity  Drug Use Not Currently    Social History   Socioeconomic History  . Marital status: Single    Spouse name: Not on file  . Number of children: Not on file  . Years of education: Not on file  . Highest education level: Not on file  Occupational History  . Not on file  Social Needs  . Financial resource strain: Not on file  . Food insecurity    Worry: Not on file    Inability: Not on file  . Transportation needs    Medical: Not on file    Non-medical: Not on file  Tobacco Use  . Smoking status: Current Every Day Smoker   Packs/day: 1.00    Types: Cigarettes  . Smokeless tobacco: Never Used  Substance and Sexual Activity  . Alcohol use: Not Currently  . Drug use: Not Currently  . Sexual activity: Not on file  Lifestyle  . Physical activity    Days per week: Not on file    Minutes per session: Not on file  . Stress: Not on file  Relationships  . Social Herbalist on phone: Not on file    Gets together: Not on file    Attends religious service: Not on file    Active member of club or organization: Not on file    Attends meetings of clubs or organizations: Not on file    Relationship status: Not on file  Other Topics Concern  . Not on file  Social History Narrative  . Not on file   Additional Social History:    Pain Medications: see PTA Prescriptions: see PTA Over the Counter: see PTA History of alcohol / drug use?: Yes                    Sleep: Good  Appetite:  Good  Current Medications: Current Facility-Administered Medications  Medication Dose Route Frequency Provider Last Rate Last Dose  . acetaminophen (TYLENOL) tablet 650 mg  650 mg Oral Q6H PRN Money, Lowry Ram, FNP   650 mg at 02/10/19 1931  . alum & mag hydroxide-simeth (MAALOX/MYLANTA)  200-200-20 MG/5ML suspension 30 mL  30 mL Oral Q4H PRN Money, Lowry Ram, FNP      . hydrOXYzine (ATARAX/VISTARIL) tablet 25 mg  25 mg Oral TID PRN Money, Lowry Ram, FNP   25 mg at 02/09/19 2151  . magnesium hydroxide (MILK OF MAGNESIA) suspension 30 mL  30 mL Oral Daily PRN Money, Lowry Ram, FNP      . risperiDONE (RISPERDAL M-TABS) disintegrating tablet 1 mg  1 mg Oral QHS Clapacs, John T, MD   1 mg at 02/10/19 2123  . traZODone (DESYREL) tablet 50 mg  50 mg Oral QHS PRN Money, Lowry Ram, FNP   50 mg at 02/09/19 2151    Lab Results: No results found for this or any previous visit (from the past 48 hour(s)).  Blood Alcohol level:  Lab Results  Component Value Date   ETH <10 02/08/2019   ETH <10 75/03/2584    Metabolic Disorder  Labs: No results found for: HGBA1C, MPG No results found for: PROLACTIN No results found for: CHOL, TRIG, HDL, CHOLHDL, VLDL, LDLCALC  Physical Findings: AIMS:  , ,  ,  ,    CIWA:    COWS:     Musculoskeletal: Strength & Muscle Tone: within normal limits Gait & Station: normal Patient leans: N/A  Psychiatric Specialty Exam: Physical Exam  ROS  Blood pressure (!) 89/61, pulse 66, temperature 97.6 F (36.4 C), temperature source Oral, resp. rate 16, height 5\' 9"  (1.753 m), weight 59.4 kg, SpO2 98 %.Body mass index is 19.35 kg/m.  General Appearance: Casual  Eye Contact:  Fair  Speech:  Normal Rate  Volume:  Normal  Mood:  Euthymic  Affect:  Restricted  Thought Process:  Coherent  Orientation:  Full (Time, Place, and Person)  Thought Content:  Appears logical during the interview  Suicidal Thoughts:  No  Homicidal Thoughts:  No  Memory:  Immediate;   Fair Recent;   Fair  Judgement:  Fair  Insight:  Shallow  Psychomotor Activity:  Normal  Concentration:  Concentration: Fair and Attention Span: Fair  Recall:  [employer] of Knowledge:  Fair  Language:  Fair  Akathisia:  No  Handed:  Right  AIMS (if indicated):     Assets:  Communication Skills Desire for Improvement Housing Physical Health  ADL's:  Intact  Cognition:  WNL  Sleep:  Number of Hours: 8     Treatment Plan Summary: Daily contact with patient to assess and evaluate symptoms and progress in treatment   Ms Crammer is a 43yo F with a h/o substance use, who was admitted to [facility] unit yesterday due to psychosis. Patient mental status appears to be improving after she was started on antipsychotic last night. On interview today she did not expressed any delusional thinking and reported good mood and sleep improvement. Will continue current medications.  Impression:  Unspecified psychotic disorder Cannabis use disorder    Plan:  -continue inpatient psych admission; 15-minute checks; daily contact with  patient to assess and evaluate symptoms and progress in treatment; psychoeducation.   -continue scheduled psych medications: Risperidone 1mg  PO QHS for psychosis, Trazodone 50mg  PO QHS PRN sleep.  -Disposition: to be determined by a main treatment team when patient is stable.   Larita Fife, MD 02/11/2019, 10:53 AM

## 2019-02-11 NOTE — Plan of Care (Signed)
Patient is showing some improvement of her emotional status. Patient is complaint with her medication regimen. Milieu remains safe.

## 2019-02-11 NOTE — Plan of Care (Signed)
  Problem: Self-Concept: Goal: Ability to identify factors that promote anxiety will improve 02/11/2019 0345 by Harl Bowie, RN Outcome: Progressing 02/11/2019 0345 by Harl Bowie, RN Outcome: Progressing  Patient able to identify factors that promote anxiety.

## 2019-02-11 NOTE — Progress Notes (Signed)
Patient alert and oriented x 4, affect is blunted, she appears anxious and restless, noted pacing the unit she denies SI/HI but endorsing AVH , she stated " I am still hearing voices and getting satellite images in my head" she denies command hallucination,  no aggressive behavior, she was receptive to staff,  Interacting appropriately with peers and staff. Patient complaint with medication regimen.  , 15 minutes safety checks maintained.

## 2019-02-11 NOTE — [facility] Group Notes (Signed)
LCSW Group Therapy Note   02/11/2019 1:15pm   Type of Therapy and Topic:  Group Therapy:  Trust and Honesty  Participation Level:  Did Not Attend  Description of Group:    In this group patients will be asked to explore the value of being honest.  Patients will be guided to discuss their thoughts, feelings, and behaviors related to honesty and trusting in others. Patients will process together how trust and honesty relate to forming relationships with peers, family members, and self. Each patient will be challenged to identify and express feelings of being vulnerable. Patients will discuss reasons why people are dishonest and identify alternative outcomes if one was truthful (to self or others). This group will be process-oriented, with patients participating in exploration of their own experiences, giving and receiving support, and processing challenge from other group members.   Therapeutic Goals: 1. Patient will identify why honesty is important to relationships and how honesty overall affects relationships.  2. Patient will identify a situation where they lied or were lied too and the  feelings, thought process, and behaviors surrounding the situation 3. Patient will identify the meaning of being vulnerable, how that feels, and how that correlates to being honest with self and others. 4. Patient will identify situations where they could have told the truth, but instead lied and explain reasons of dishonesty.   Summary of Patient Progress Pt was invited to attend group but chose not to attend. CSW will continue to encourage pt to attend group throughout their admission.     Therapeutic Modalities:   Cognitive Behavioral Therapy Solution Focused Therapy Motivational Interviewing Brief Therapy  Kaiyana Bedore  CUEBAS-COLON, LCSW 02/11/2019 12:40 PM

## 2019-02-12 NOTE — Plan of Care (Signed)
Patient stayed in bed until bedtime. Currently in the dayroom  eating lunch. Reports that she wanted to get some rest "that medication made me sleep...ibuprofen needed it". Pleasant and cooperative. Denying thoughts of self harm. Denying hallucinations. Reports that appetite is good. No sign of distress.

## 2019-02-12 NOTE — B[facility] Group Notes (Signed)
LCSW Group Therapy Note 02/12/2019 1:15pm  Type of Therapy and Topic: Group Therapy: Feelings Around Returning Home & Establishing a Supportive Framework and Supporting Oneself When Supports Not Available  Participation Level: Did Not Attend  Description of Group:  Patients first processed thoughts and feelings about upcoming discharge. These included fears of upcoming changes, lack of change, new living environments, judgements and expectations from others and overall stigma of mental health issues. The group then discussed the definition of a supportive framework, what that looks and feels like, and how do to discern it from an unhealthy non-supportive network. The group identified different types of supports as well as what to do when your family/friends are less than helpful or unavailable  Therapeutic Goals  1. Patient will identify one healthy supportive network that they can use at discharge. 2. Patient will identify one factor of a supportive framework and how to tell it from an unhealthy network. 3. Patient able to identify one coping skill to use when they do not have positive supports from others. 4. Patient will demonstrate ability to communicate their needs through discussion and/or role plays.  Summary of Patient Progress:  Pt was invited to attend group but chose not to attend. CSW will continue to encourage pt to attend group throughout their admission.  Therapeutic Modalities Cognitive Behavioral Therapy Motivational Interviewing   Cheree Ditto, LCSW 02/12/2019 12:34 PM

## 2019-02-12 NOTE — Plan of Care (Signed)
  Problem: Education: Goal: Knowledge of [place] [facility] Education information/materials will improve Outcome: Progressing Goal: Emotional status will improve Outcome: Progressing Goal: Mental status will improve Outcome: Progressing Goal: Verbalization of understanding the information provided will improve Outcome: Progressing  D: Patient asking about a test result that says what antibiotic she should be taking. Denies SI, HI and AVH. Interacting appropriately with staff and peers. A: Continue to monitor for safety R: Safety maintained.

## 2019-02-12 NOTE — Progress Notes (Signed)
D: Patient asking about a test result that says what antibiotic she should be taking. Denies SI, HI and AVH. Interacting appropriately with staff and peers. A: Continue to monitor for safety R: Safety maintained.

## 2019-02-12 NOTE — Progress Notes (Signed)
Patient was mostly in the dayroom talking to peers. Pleasant upon approach. Depressed but willing to work on her mental health problem. Reported that she is feeling slight improvement, the she is receiving the right medication. Patient ate her meals as served. Did not attend group. Patient took a nap and was visible in the milieu again toward the evening. Had no main concern.

## 2019-02-12 NOTE — Progress Notes (Signed)
[facility] MD Progress Note  02/12/2019 12:48 PM Joia Doyle  MRN:  [id number]   Ms Soulier is a 43yo F with a h/o substance use, who was admitted to [facility] unit two days ago due to psychosis. Patient seen.  Chart reviewed. Patient discussed with nursing; no overnight events reported. Subjective:   Patient reports "I am feeling good. I slept well. I ate. I am not suicidal or homicidal". She reports she had not heard any "voices" lately. She still thinks she has cameras inserted by someone in her phone; her plan is to send a friend to check her place for cameras prior to returning back home. She denies side effects from medications.   Principal Problem: Psychosis ([place]) Diagnosis: Principal Problem:   Psychosis ([place]) Active Problems:   Cannabis abuse  Total Time spent with patient: 15 minutes  Past Psychiatric History: see H&P  Past Medical History:  Past Medical History:  Diagnosis Date  . Cervical cancer [facility])     Past Surgical History:  Procedure Laterality Date  . ABDOMINAL HYSTERECTOMY     partial/radical  . arm surgery  2012   Family History: History reviewed. No pertinent family history. Family Psychiatric  History:see H&P Social History:  Social History   Substance and Sexual Activity  Alcohol Use Not Currently     Social History   Substance and Sexual Activity  Drug Use Not Currently    Social History   Socioeconomic History  . Marital status: Single    Spouse name: Not on file  . Number of children: Not on file  . Years of education: Not on file  . Highest education level: Not on file  Occupational History  . Not on file  Social Needs  . Financial resource strain: Not on file  . Food insecurity    Worry: Not on file    Inability: Not on file  . Transportation needs    Medical: Not on file    Non-medical: Not on file  Tobacco Use  . Smoking status: Current Every Day Smoker    Packs/day: 1.00    Types: Cigarettes  . Smokeless tobacco: Never Used   Substance and Sexual Activity  . Alcohol use: Not Currently  . Drug use: Not Currently  . Sexual activity: Not on file  Lifestyle  . Physical activity    Days per week: Not on file    Minutes per session: Not on file  . Stress: Not on file  Relationships  . Social Herbalist on phone: Not on file    Gets together: Not on file    Attends religious service: Not on file    Active member of club or organization: Not on file    Attends meetings of clubs or organizations: Not on file    Relationship status: Not on file  Other Topics Concern  . Not on file  Social History Narrative  . Not on file   Additional Social History:    Pain Medications: see PTA Prescriptions: see PTA Over the Counter: see PTA History of alcohol / drug use?: Yes                    Sleep: Good  Appetite:  Good  Current Medications: Current Facility-Administered Medications  Medication Dose Route Frequency Provider Last Rate Last Dose  . acetaminophen (TYLENOL) tablet 650 mg  650 mg Oral Q6H PRN Money, Lowry Ram, FNP   650 mg at 02/10/19 1931  . alum &  mag hydroxide-simeth (MAALOX/MYLANTA) 200-200-20 MG/5ML suspension 30 mL  30 mL Oral Q4H PRN Money, Lowry Ram, FNP      . hydrOXYzine (ATARAX/VISTARIL) tablet 25 mg  25 mg Oral TID PRN Money, Lowry Ram, FNP   25 mg at 02/09/19 2151  . magnesium hydroxide (MILK OF MAGNESIA) suspension 30 mL  30 mL Oral Daily PRN Money, Lowry Ram, FNP      . risperiDONE (RISPERDAL M-TABS) disintegrating tablet 1 mg  1 mg Oral QHS Clapacs, John T, MD   1 mg at 02/11/19 2108  . traZODone (DESYREL) tablet 50 mg  50 mg Oral QHS PRN Money, Lowry Ram, FNP   50 mg at 02/11/19 2109    Lab Results: No results found for this or any previous visit (from the past 48 hour(s)).  Blood Alcohol level:  Lab Results  Component Value Date   ETH <10 02/08/2019   ETH <10 79/48/0165    Metabolic Disorder Labs: No results found for: HGBA1C, MPG No results found for:  PROLACTIN No results found for: CHOL, TRIG, HDL, CHOLHDL, VLDL, LDLCALC  Physical Findings: AIMS:  , ,  ,  ,    CIWA:    COWS:     Musculoskeletal: Strength & Muscle Tone: within normal limits Gait & Station: normal Patient leans: N/A  Psychiatric Specialty Exam: Physical Exam   ROS   Blood pressure 94/70, pulse (!) 57, temperature 97.8 F (36.6 C), temperature source Oral, resp. rate 16, height 5\' 9"  (1.753 m), weight 59.4 kg, SpO2 99 %.Body mass index is 19.35 kg/m.  General Appearance: Casual  Eye Contact:  Fair  Speech:  Normal Rate  Volume:  Normal  Mood:  Euthymic  Affect:  Restricted  Thought Process:  Coherent  Orientation:  Full (Time, Place, and Person)  Thought Content:  Appears logical during the interview  Suicidal Thoughts:  No  Homicidal Thoughts:  No  Memory:  Immediate;   Fair Recent;   Fair  Judgement:  Fair  Insight:  Shallow  Psychomotor Activity:  Normal  Concentration:  Concentration: Fair and Attention Span: Fair  Recall:  [employer] of Knowledge:  Fair  Language:  Fair  Akathisia:  No  Handed:  Right  AIMS (if indicated):     Assets:  Communication Skills Desire for Improvement Housing Physical Health  ADL's:  Intact  Cognition:  WNL  Sleep:  Number of Hours: 6     Treatment Plan Summary: Daily contact with patient to assess and evaluate symptoms and progress in treatment   Ms Handa is a 43yo F with a h/o substance use, who was admitted to [facility] unit two days ago due to psychosis. Patient mental status appears to be improving after initiation of antipsychotic, although she still expresses paranoid delusions. Will continue current medications and evaluate the progress daily.  Impression:  Unspecified psychotic disorder Cannabis use disorder    Plan:  -continue inpatient psych admission; 15-minute checks; daily contact with patient to assess and evaluate symptoms and progress in treatment; psychoeducation.  -continue scheduled  psych medications: Risperidone 1mg  PO QHS for psychosis, Trazodone 50mg  PO QHS PRN sleep.  -Disposition: to be determined by a main treatment team when patient is stable.   Larita Fife, MD 02/12/2019, 12:48 PM

## 2019-02-13 MED ORDER — RISPERIDONE 1 MG PO TABS: 1.0000 mg | ORAL_TABLET | Freq: Every day | ORAL | 0 refills | Status: DC

## 2019-02-13 MED ORDER — TRAZODONE HCL 50 MG PO TABS: 50.0000 mg | ORAL_TABLET | Freq: Every evening | ORAL | 0 refills | Status: DC | PRN

## 2019-02-13 MED ORDER — RISPERIDONE 1 MG PO TABS
1.0000 mg | ORAL_TABLET | Freq: Every day | ORAL | Status: DC
  Administered 2019-02-13: 1 mg via ORAL
  Filled 2019-02-13: qty 1

## 2019-02-13 NOTE — Plan of Care (Signed)
Patient stayed in bed most of the time states "I am enjoying my peacefulness here.I don't need to deal with the family or anyone.I am not used to these sleeping medications.I sleep 14 to 16 hrs."Patient denies SI,HI and AVH.Pleasant and cooperative on approach.Appetite good.Support and encouragement given.

## 2019-02-13 NOTE — Plan of Care (Signed)
  Problem: Education: Goal: Knowledge of [place] [facility] Education information/materials will improve Outcome: Progressing Goal: Emotional status will improve Outcome: Progressing Goal: Mental status will improve Outcome: Progressing Goal: Verbalization of understanding the information provided will improve Outcome: Progressing  D: Patient pleasant and cooperative. Denies SI, HI and AVH. Contracts for safety. A: Continue to monitor for safety R: Safety maintained

## 2019-02-13 NOTE — Progress Notes (Signed)
[facility] MD Progress Note  02/13/2019 5:01 PM Claire Harris  MRN:  [id number] Subjective: Patient seen chart reviewed.  Patient reports she is feeling much better.  She denies hearing voices since being in the hospital.  She says that she feels like her thoughts are much more organized and well put together.  Denies suicidal or homicidal ideation.  Getting along well with other patients on the unit with no sign of bizarre behavior.  Patient says she is tolerating the Risperdal with no side effects Principal Problem: Psychosis ([place]) Diagnosis: Principal Problem:   Psychosis ([place]) Active Problems:   Cannabis abuse  Total Time spent with patient: 30 minutes  Past Psychiatric History: Past history is somewhat unclear  Past Medical History:  Past Medical History:  Diagnosis Date  . Cervical cancer [facility])     Past Surgical History:  Procedure Laterality Date  . ABDOMINAL HYSTERECTOMY     partial/radical  . arm surgery  2012   Family History: History reviewed. No pertinent family history. Family Psychiatric  History: See previous Social History:  Social History   Substance and Sexual Activity  Alcohol Use Not Currently     Social History   Substance and Sexual Activity  Drug Use Not Currently    Social History   Socioeconomic History  . Marital status: Single    Spouse name: Not on file  . Number of children: Not on file  . Years of education: Not on file  . Highest education level: Not on file  Occupational History  . Not on file  Social Needs  . Financial resource strain: Not on file  . Food insecurity    Worry: Not on file    Inability: Not on file  . Transportation needs    Medical: Not on file    Non-medical: Not on file  Tobacco Use  . Smoking status: Current Every Day Smoker    Packs/day: 1.00    Types: Cigarettes  . Smokeless tobacco: Never Used  Substance and Sexual Activity  . Alcohol use: Not Currently  . Drug use: Not Currently  . Sexual activity: Not  on file  Lifestyle  . Physical activity    Days per week: Not on file    Minutes per session: Not on file  . Stress: Not on file  Relationships  . Social Herbalist on phone: Not on file    Gets together: Not on file    Attends religious service: Not on file    Active member of club or organization: Not on file    Attends meetings of clubs or organizations: Not on file    Relationship status: Not on file  Other Topics Concern  . Not on file  Social History Narrative  . Not on file   Additional Social History:    Pain Medications: see PTA Prescriptions: see PTA Over the Counter: see PTA History of alcohol / drug use?: Yes                    Sleep: Fair  Appetite:  Fair  Current Medications: Current Facility-Administered Medications  Medication Dose Route Frequency Provider Last Rate Last Dose  . acetaminophen (TYLENOL) tablet 650 mg  650 mg Oral Q6H PRN Money, Lowry Ram, FNP   650 mg at 02/10/19 1931  . alum & mag hydroxide-simeth (MAALOX/MYLANTA) 200-200-20 MG/5ML suspension 30 mL  30 mL Oral Q4H PRN Money, Lowry Ram, FNP      . hydrOXYzine (ATARAX/VISTARIL)  tablet 25 mg  25 mg Oral TID PRN Money, Lowry Ram, FNP   25 mg at 02/09/19 2151  . magnesium hydroxide (MILK OF MAGNESIA) suspension 30 mL  30 mL Oral Daily PRN Money, Lowry Ram, FNP      . risperiDONE (RISPERDAL M-TABS) disintegrating tablet 1 mg  1 mg Oral QHS Risha Barretta T, MD   1 mg at 02/12/19 2118  . traZODone (DESYREL) tablet 50 mg  50 mg Oral QHS PRN Money, Lowry Ram, FNP   50 mg at 02/12/19 2118    Lab Results: No results found for this or any previous visit (from the past 48 hour(s)).  Blood Alcohol level:  Lab Results  Component Value Date   ETH <10 02/08/2019   ETH <10 49/44/9675    Metabolic Disorder Labs: No results found for: HGBA1C, MPG No results found for: PROLACTIN No results found for: CHOL, TRIG, HDL, CHOLHDL, VLDL, LDLCALC  Physical Findings: AIMS:  , ,  ,  ,    CIWA:     COWS:     Musculoskeletal: Strength & Muscle Tone: within normal limits Gait & Station: normal Patient leans: N/A  Psychiatric Specialty Exam: Physical Exam  Nursing note and vitals reviewed. Constitutional: She appears well-developed and well-nourished.  HENT:  Head: Normocephalic and atraumatic.  Eyes: Pupils are equal, round, and reactive to light. Conjunctivae are normal.  Neck: Normal range of motion.  Cardiovascular: Regular rhythm and normal heart sounds.  Respiratory: Effort normal.  GI: Soft.  Musculoskeletal: Normal range of motion.  Neurological: She is alert.  Skin: Skin is warm and dry.  Psychiatric: She has a normal mood and affect. Her speech is normal and behavior is normal. Judgment and thought content normal. Cognition and memory are normal.    Review of Systems  Constitutional: Negative.   HENT: Negative.   Eyes: Negative.   Respiratory: Negative.   Cardiovascular: Negative.   Gastrointestinal: Negative.   Musculoskeletal: Negative.   Skin: Negative.   Neurological: Negative.   Psychiatric/Behavioral: Negative.     Blood pressure (!) 89/60, pulse 74, temperature 98.1 F (36.7 C), temperature source Oral, resp. rate 16, height 5\' 9"  (1.753 m), weight 59.4 kg, SpO2 98 %.Body mass index is 19.35 kg/m.  General Appearance: Casual  Eye Contact:  Good  Speech:  Clear and Coherent  Volume:  Normal  Mood:  Euthymic  Affect:  Congruent  Thought Process:  Goal Directed  Orientation:  Full (Time, Place, and Person)  Thought Content:  Logical  Suicidal Thoughts:  No  Homicidal Thoughts:  No  Memory:  Immediate;   Fair Recent;   Fair Remote;   Fair  Judgement:  Fair  Insight:  Fair  Psychomotor Activity:  Normal  Concentration:  Concentration: Fair  Recall:  [employer] of Knowledge:  Fair  Language:  Fair  Akathisia:  No  Handed:  Right  AIMS (if indicated):     Assets:  Desire for Improvement Housing Physical Health  ADL's:  Intact   Cognition:  WNL  Sleep:  Number of Hours: 6.75     Treatment Plan Summary: Daily contact with patient to assess and evaluate symptoms and progress in treatment, Medication management and Plan Patient seems to be doing better and appears to be stable and no longer dangerous.  I will go ahead and start the process for discharge tomorrow with follow-up at [facility] which she has already agreed to.  Alethia Berthold, MD 02/13/2019, 5:01 PM

## 2019-02-13 NOTE — BHH Group Notes (Signed)
LCSW Group Therapy Note   02/13/2019 11:00 AM   Type of Therapy and Topic:  Group Therapy:  Overcoming Obstacles   Participation Level:  Did Not Attend   Description of Group:    In this group patients will be encouraged to explore what they see as obstacles to their own wellness and recovery. They will be guided to discuss their thoughts, feelings, and behaviors related to these obstacles. The group will process together ways to cope with barriers, with attention given to specific choices patients can make. Each patient will be challenged to identify changes they are motivated to make in order to overcome their obstacles. This group will be process-oriented, with patients participating in exploration of their own experiences as well as giving and receiving support and challenge from other group members.   Therapeutic Goals: 1. Patient will identify personal and current obstacles as they relate to admission. 2. Patient will identify barriers that currently interfere with their wellness or overcoming obstacles.  3. Patient will identify feelings, thought process and behaviors related to these barriers. 4. Patient will identify two changes they are willing to make to overcome these obstacles:      Summary of Patient Progress x     Therapeutic Modalities:   Cognitive Behavioral Therapy Solution Focused Therapy Motivational Interviewing Relapse Prevention Therapy  Evalina Field, MSW, LCSW Clinical Social Work 02/13/2019 11:00 AM

## 2019-02-13 NOTE — Progress Notes (Signed)
D: Patient pleasant and cooperative. Denies SI, HI and AVH. Contracts for safety. A: Continue to monitor for safety R: Safety maintained

## 2019-02-13 NOTE — Progress Notes (Signed)
Recreation Therapy Notes   Date: 02/13/2019  Time: 9:30 am   Location: Craft room   Behavioral response: N/A   Intervention Topic: Time-Management  Discussion/Intervention: Patient did not attend group.   Clinical Observations/Feedback:  Patient did not attend group.   Casee Knepp LRT/CTRS        Holliday Sheaffer 02/13/2019 10:20 AM

## 2019-02-14 MED ORDER — RISPERIDONE 1 MG PO TABS: 1.0000 mg | ORAL_TABLET | Freq: Every day | ORAL | 2 refills | Status: DC

## 2019-02-14 MED ORDER — TRAZODONE HCL 50 MG PO TABS: 50.0000 mg | ORAL_TABLET | Freq: Every evening | ORAL | 2 refills | Status: DC | PRN

## 2019-02-14 NOTE — BHH Suicide Risk Assessment (Signed)
[facility] Discharge Suicide Risk Assessment   Principal Problem: Psychosis [facility]) Discharge Diagnoses: Principal Problem:   Psychosis ([place]) Active Problems:   Cannabis abuse   Total Time spent with patient: 45 minutes  Musculoskeletal: Strength & Muscle Tone: within normal limits Gait & Station: normal Patient leans: N/A  Psychiatric Specialty Exam: Review of Systems  Constitutional: Negative.   HENT: Negative.   Eyes: Negative.   Respiratory: Negative.   Cardiovascular: Negative.   Gastrointestinal: Negative.   Musculoskeletal: Negative.   Skin: Negative.   Neurological: Negative.   Psychiatric/Behavioral: Negative.     Blood pressure 99/63, pulse 68, temperature 97.7 F (36.5 C), temperature source Oral, resp. rate 16, height 5\' 9"  (1.753 m), weight 59.4 kg, SpO2 99 %.Body mass index is 19.35 kg/m.  General Appearance: Casual  Eye Contact::  Good  Speech:  Clear and [id number]  Volume:  Normal  Mood:  Euthymic  Affect:  Congruent  Thought Process:  Goal Directed  Orientation:  Full (Time, Place, and Person)  Thought Content:  Logical  Suicidal Thoughts:  No  Homicidal Thoughts:  No  Memory:  Immediate;   Fair Recent;   Fair Remote;   Fair  Judgement:  Fair  Insight:  Shallow  Psychomotor Activity:  Normal  Concentration:  Fair  Recall:  [employer] of Knowledge:Fair  Language: Fair  Akathisia:  No  Handed:  Right  AIMS (if indicated):     Assets:  Desire for Improvement  Sleep:  Number of Hours: 6.15  Cognition: WNL  ADL's:  Intact   Mental Status Per Nursing Assessment::   On Admission:  NA  Demographic Factors:  Caucasian and Living alone  Loss Factors: Financial problems/change in socioeconomic status  Historical Factors: Impulsivity  Risk Reduction Factors:   Religious beliefs about death and Positive therapeutic relationship  Continued Clinical Symptoms:  Alcohol/Substance Abuse/Dependencies  Cognitive Features That Contribute To Risk:   None    Suicide Risk:  Minimal: No identifiable suicidal ideation.  Patients presenting with no risk factors but with morbid ruminations; may be classified as minimal risk based on the severity of the depressive symptoms  Follow-up Information    Patient declined. Follow up.   Why: Patient declined referral.  List of local providers will be given to patient. Contact information: Patient declined referral at this time.           Plan Of Care/Follow-up recommendations:  Activity:  Activity as tolerated Diet:  Regular diet Other:  Follow-up with [facility] continue current medication  Alethia Berthold, MD 02/14/2019, 9:19 AM

## 2019-02-14 NOTE — Discharge Summary (Signed)
Physician Discharge Summary Note  Patient:  Claire Harris is an 43 y.o., female MRN:  [id number] DOB:  Feb 13, 1976 Patient phone:  [phone] (home)  Patient address:   [place] [address],  Total Time spent with patient: 45 minutes  Date of Admission:  02/09/2019 Date of Discharge: February 14, 2019  Reason for Admission: Patient was admitted because of concerns that she was psychotic hallucinating and confused at home.  Contacting law enforcement for what appeared to be psychotic symptoms and referred then through [facility]  Principal Problem: Psychosis [facility]) Discharge Diagnoses: Principal Problem:   Psychosis ([place]) Active Problems:   Cannabis abuse   Past Psychiatric History: Past history of substance abuse problems.  No evidence of past diagnosis with psychotic disorder no history of self injury  Past Medical History:  Past Medical History:  Diagnosis Date  . Cervical cancer [facility])     Past Surgical History:  Procedure Laterality Date  . ABDOMINAL HYSTERECTOMY     partial/radical  . arm surgery  2012   Family History: History reviewed. No pertinent family history. Family Psychiatric  History: None reported Social History:  Social History   Substance and Sexual Activity  Alcohol Use Not Currently     Social History   Substance and Sexual Activity  Drug Use Not Currently    Social History   Socioeconomic History  . Marital status: Single    Spouse name: Not on file  . Number of children: Not on file  . Years of education: Not on file  . Highest education level: Not on file  Occupational History  . Not on file  Social Needs  . Financial resource strain: Not on file  . Food insecurity    Worry: Not on file    Inability: Not on file  . Transportation needs    Medical: Not on file    Non-medical: Not on file  Tobacco Use  . Smoking status: Current Every Day Smoker    Packs/day: 1.00    Types: Cigarettes  . Smokeless tobacco: Never Used   Substance and Sexual Activity  . Alcohol use: Not Currently  . Drug use: Not Currently  . Sexual activity: Not on file  Lifestyle  . Physical activity    Days per week: Not on file    Minutes per session: Not on file  . Stress: Not on file  Relationships  . Social Herbalist on phone: Not on file    Gets together: Not on file    Attends religious service: Not on file    Active member of club or organization: Not on file    Attends meetings of clubs or organizations: Not on file    Relationship status: Not on file  Other Topics Concern  . Not on file  Social History Narrative  . Not on file    Hospital Course: Admitted to the psychiatric ward.  Patient was still endorsing what seemed to certainly be delusional content on admission.  Slightly agitated but not aggressive or hostile.  Did not show any dangerous behavior on herself or others for the entire time she was in the hospital.  She was agreeable to starting medicine and was treated with low-dose Risperdal at nighttime which she took without difficulty or side effect.  She interacted appropriately with the liaison from [place] and eventually agreed that there were some problems she was having with thinking and that she was feeling a little better but agreed to follow-up  at [facility].  At the time of discharge patient is not reporting any auditory hallucinations and her discussion of delusions has calm down significantly to the point where she no longer seems to think it is very important.  She will be discharged with a supply of medicine and prescriptions and follow-up with outpatient at [facility] and has been educated about the importance of staying off of any drugs of abuse.  Physical Findings: AIMS:  , ,  ,  ,    CIWA:    COWS:     Musculoskeletal: Strength & Muscle Tone: within normal limits Gait & Station: normal Patient leans: N/A  Psychiatric Specialty Exam: Physical Exam  Nursing note and vitals reviewed. Constitutional:  She appears well-developed and well-nourished.  HENT:  Head: Normocephalic and atraumatic.  Eyes: Pupils are equal, round, and reactive to light. Conjunctivae are normal.  Neck: Normal range of motion.  Cardiovascular: Regular rhythm and normal heart sounds.  Respiratory: Effort normal.  GI: Soft.  Musculoskeletal: Normal range of motion.  Neurological: She is alert.  Skin: Skin is warm and dry.  Psychiatric: She has a normal mood and affect. Her behavior is normal. Judgment and thought content normal.    Review of Systems  Constitutional: Negative.   HENT: Negative.   Eyes: Negative.   Respiratory: Negative.   Cardiovascular: Negative.   Gastrointestinal: Negative.   Musculoskeletal: Negative.   Skin: Negative.   Neurological: Negative.   Psychiatric/Behavioral: Negative.     Blood pressure 99/63, pulse 68, temperature 97.7 F (36.5 C), temperature source Oral, resp. rate 16, height 5\' 9"  (1.753 m), weight 59.4 kg, SpO2 99 %.Body mass index is 19.35 kg/m.  General Appearance: Casual  Eye Contact:  Good  Speech:  Clear and Coherent  Volume:  Normal  Mood:  Euthymic  Affect:  Congruent  Thought Process:  Coherent  Orientation:  Full (Time, Place, and Person)  Thought Content:  Logical  Suicidal Thoughts:  No  Homicidal Thoughts:  No  Memory:  Immediate;   Fair Recent;   Fair Remote;   Fair  Judgement:  Fair  Insight:  Fair  Psychomotor Activity:  Normal  Concentration:  Concentration: Fair  Recall:  [employer] of Knowledge:  Fair  Language:  Fair  Akathisia:  No  Handed:  Right  AIMS (if indicated):     Assets:  Desire for Improvement Housing Physical Health  ADL's:  Intact  Cognition:  WNL  Sleep:  Number of Hours: 6.15     Have you used any form of tobacco in the last 30 days? (Cigarettes, Smokeless Tobacco, Cigars, and/or Pipes): Yes  Has this patient used any form of tobacco in the last 30 days? (Cigarettes, Smokeless Tobacco, Cigars, and/or Pipes)  Yes, Yes, A prescription for an FDA-approved tobacco cessation medication was offered at discharge and the patient refused  Blood Alcohol level:  Lab Results  Component Value Date   ETH <10 02/08/2019   ETH <10 63/78/5885    Metabolic Disorder Labs:  No results found for: HGBA1C, MPG No results found for: PROLACTIN No results found for: CHOL, TRIG, HDL, CHOLHDL, VLDL, LDLCALC  See Psychiatric Specialty Exam and Suicide Risk Assessment completed by Attending Physician prior to discharge.  Discharge destination:  Home  Is patient on multiple antipsychotic therapies at discharge:  No   Has Patient had three or more failed trials of antipsychotic monotherapy by history:  No  Recommended Plan for Multiple Antipsychotic Therapies: NA  Discharge Instructions  Diet - low sodium heart healthy   Complete by: As directed    Increase activity slowly   Complete by: As directed      Allergies as of 02/14/2019   No Known Allergies     Medication List    STOP taking these medications   acetaminophen 325 MG tablet Commonly known as: TYLENOL   cefUROXime 250 MG tablet Commonly known as: Ceftin   cephALEXin 500 MG capsule Commonly known as: KEFLEX   ciprofloxacin 500 MG tablet Commonly known as: Cipro   docusate sodium 100 MG capsule Commonly known as: COLACE   HYDROcodone-acetaminophen 5-325 MG tablet Commonly known as: NORCO/VICODIN   ibuprofen 800 MG tablet Commonly known as: ADVIL   metroNIDAZOLE 500 MG tablet Commonly known as: FLAGYL   senna 8.6 MG Tabs tablet Commonly known as: SENOKOT     TAKE these medications     Indication  risperiDONE 1 MG tablet Commonly known as: RISPERDAL Take 1 tablet (1 mg total) by mouth at bedtime.  Indication: Delusions   traZODone 50 MG tablet Commonly known as: DESYREL Take 1 tablet (50 mg total) by mouth at bedtime as needed for sleep.  Indication: Trouble Sleeping      Follow-up Information    Patient declined.  Follow up.   Why: Patient declined referral.  List of local providers will be given to patient. Contact information: Patient declined referral at this time.           Follow-up recommendations:  Activity:  Activity as tolerated Diet:  Regular diet Other:  Follow-up with [facility] and continue current medicine  Comments: Psychoeducation and counseling about the importance of not getting back into any drug abuse.  Signed: Alethia Berthold, MD 02/14/2019, 9:24 AM

## 2019-02-14 NOTE — Plan of Care (Signed)
  Problem: Group Participation Goal: STG - Patient will engage in groups without prompting or encouragement from LRT x3 group sessions within 5 recreation therapy group sessions Description: STG - Patient will engage in groups without prompting or encouragement from LRT x3 group sessions within 5 recreation therapy group sessions 02/14/2019 1204 by Ernest Haber, LRT Outcome: Not Applicable 08/21/3610 [id number] by Ernest Haber, LRT Outcome: Not Met (add Reason) Note: Patient did not attend any groups.

## 2019-02-14 NOTE — Progress Notes (Signed)
Recreation Therapy Notes  INPATIENT RECREATION TR PLAN  Patient Details Name: Claire Harris MRN: [id number] DOB: Jan 27, 1976 Today's Date: 02/14/2019  Rec Therapy Plan Is patient appropriate for Therapeutic Recreation?: Yes Treatment times per week: At least 3 Estimated Length of Stay: 5-7 days TR Treatment/Interventions: Group participation (Comment)  Discharge Criteria Pt will be discharged from therapy if:: Discharged Treatment plan/goals/alternatives discussed and agreed upon by:: Patient/family  Discharge Summary Short term goals set: Patient will engage in groups without prompting or encouragement from LRT x3 group sessions within 5 recreation therapy group sessions Short term goals met: Not met Reason goals not met: Patient did not attend any groups Therapeutic equipment acquired: N/A Reason patient discharged from therapy: Discharge from hospital Pt/family agrees with progress & goals achieved: Yes Date patient discharged from therapy: 02/14/19   Allisson Schindel 02/14/2019, 12:06 PM

## 2019-02-14 NOTE — Progress Notes (Signed)
  [facility] Adult Case Management Discharge Plan :  Will you be returning to the same living situation after discharge:  Yes,  home At discharge, do you have transportation home?: Yes,  will call family Do you have the ability to pay for your medications: Yes,  mental health  Release of information consent forms completed and in the chart;   Patient to Follow up at: Follow-up Information    [facility] Follow up on 02/15/2019.   Why: Please meet Sherrian Divers for peer support services Wednesday 02/15/19 at 8:00AM. Thank You! Contact information: [address]  [place] [address] [phone]           Next level of care provider has access to [place] and Suicide Prevention discussed: Yes,  SPE completed with pts father  Have you used any form of tobacco in the last 30 days? (Cigarettes, Smokeless Tobacco, Cigars, and/or Pipes): Yes  Has patient been referred to the Quitline?: Patient refused referral  Patient has been referred for addiction treatment: [place], LCSW 02/14/2019, 10:28 AM

## 2019-02-14 NOTE — Progress Notes (Signed)
Patient alert and oriented x 4, affect is flat but she brighten upon approach, patient's thoughts are organized and coherent, no distress noted interacting appropriately with peers and staff. Patient denies been paranoid no hypervigilance or suspicion of staff, she appears less anxious rated anxiety a 4/10. Patient denies SI/HI/AVH, emotional support given to patient, she is compliant with medication regimen, and attended evening wrap up group, 15 minutes safety checks maintained will continue to monitor.

## 2019-02-14 NOTE — Plan of Care (Signed)
  Problem: Education: Goal: Will be free of psychotic symptoms Outcome: Progressing  Patient appears free of psychosis.

## 2019-02-14 NOTE — Progress Notes (Signed)
Recreation Therapy Notes   Date: 02/14/2019  Time: 9:30 am   Location: Craft room   Behavioral response: N/A   Intervention Topic: Coping-Skills  Discussion/Intervention: Patient did not attend group.   Clinical Observations/Feedback:  Patient did not attend group.   Jatavis Malek LRT/CTRS        Amon Costilla 02/14/2019 11:32 AM

## 2019-02-14 NOTE — Plan of Care (Signed)
  Problem: Education: Goal: Knowledge of [facility] Education information/materials will improve Outcome: Adequate for Discharge Goal: Emotional status will improve Outcome: Adequate for Discharge Goal: Mental status will improve Outcome: Adequate for Discharge Goal: Verbalization of understanding the information provided will improve Outcome: Adequate for Discharge   Problem: Self-Concept: Goal: Ability to identify factors that promote anxiety will improve Outcome: Adequate for Discharge Goal: Level of anxiety will decrease Outcome: Adequate for Discharge   Problem: Education: Goal: Will be free of psychotic symptoms Outcome: Adequate for Discharge Goal: Knowledge of the prescribed therapeutic regimen will improve Outcome: Adequate for Discharge   Problem: Health Behavior/Discharge Planning: Goal: Compliance with prescribed medication regimen will improve Outcome: Adequate for Discharge

## 2019-02-14 NOTE — Progress Notes (Addendum)
D: Patient is aware of  Discharge this shift .  A: Patient denies suicidal /homicidal ideations. Patient received all belongings brought in  No Storage medications. Writer reviewed Discharge Summary, Suicide Risk Assessment, and Transitional Record. Patient also received Prescriptions   from  MD.  Aware  Of follow up appointment .  R: Patient left unit with no questions  Or concerns  With city bus

## 2019-02-14 NOTE — Plan of Care (Signed)
  Problem: Education: Goal: Knowledge of [facility] Education information/materials will improve 02/14/2019 1147 by Leodis Liverpool, RN Outcome: Adequate for Discharge 02/14/2019 1036 by Leodis Liverpool, RN Outcome: Adequate for Discharge Goal: Emotional status will improve 02/14/2019 1147 by Leodis Liverpool, RN Outcome: Adequate for Discharge 02/14/2019 1036 by Leodis Liverpool, RN Outcome: Adequate for Discharge Goal: Mental status will improve 02/14/2019 1147 by Leodis Liverpool, RN Outcome: Adequate for Discharge 02/14/2019 1036 by Leodis Liverpool, RN Outcome: Adequate for Discharge Goal: Verbalization of understanding the information provided will improve 02/14/2019 1147 by Leodis Liverpool, RN Outcome: Adequate for Discharge 02/14/2019 1036 by Leodis Liverpool, RN Outcome: Adequate for Discharge   Problem: Self-Concept: Goal: Ability to identify factors that promote anxiety will improve 02/14/2019 1147 by Leodis Liverpool, RN Outcome: Adequate for Discharge 02/14/2019 1036 by Leodis Liverpool, RN Outcome: Adequate for Discharge Goal: Level of anxiety will decrease 02/14/2019 1147 by Leodis Liverpool, RN Outcome: Adequate for Discharge 02/14/2019 1036 by Leodis Liverpool, RN Outcome: Adequate for Discharge   Problem: Education: Goal: Will be free of psychotic symptoms 02/14/2019 1147 by Leodis Liverpool, RN Outcome: Adequate for Discharge 02/14/2019 1036 by Leodis Liverpool, RN Outcome: Adequate for Discharge Goal: Knowledge of the prescribed therapeutic regimen will improve 02/14/2019 1147 by Leodis Liverpool, RN Outcome: Adequate for Discharge 02/14/2019 1036 by Leodis Liverpool, RN Outcome: Adequate for Discharge   Problem: Education: Goal: Knowledge of the prescribed therapeutic regimen will improve 02/14/2019 1147 by Leodis Liverpool, RN Outcome: Adequate for Discharge 02/14/2019 1036 by Leodis Liverpool, RN Outcome: Adequate for Discharge   Problem: Health Behavior/Discharge  Planning: Goal: Compliance with prescribed medication regimen will improve 02/14/2019 1147 by Leodis Liverpool, RN Outcome: Adequate for Discharge 02/14/2019 1036 by Leodis Liverpool, RN Outcome: Adequate for Discharge

## 2019-05-14 IMAGING — CT CT ABD-PELV W/ CM
2 of 4 series · 16 of 46 positions shown, 18 images · IV contrast (APPLIED)
Comparison: None.

CLINICAL DATA: Acute generalized abdominal pain.

EXAM:
CT ABDOMEN AND PELVIS WITH CONTRAST
TECHNIQUE: Multidetector CT imaging of the abdomen and pelvis was performed
using the standard protocol following bolus administration of
intravenous contrast.
CONTRAST:  100mL UXBGCD-J55 IOPAMIDOL (UXBGCD-J55) INJECTION 61%

[Series 2: routine abd/pel with · axial · 0.69mm/px · z∈[-514,-64]mm · 13 of 98 slices shown, 15 images]
[im 4/98  soft-tissue]
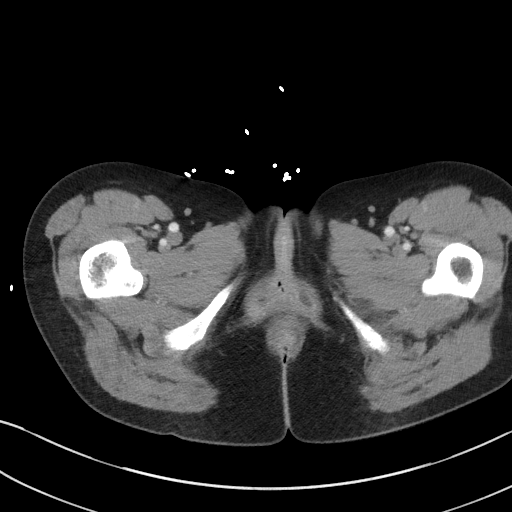
[im 4/98  bone]
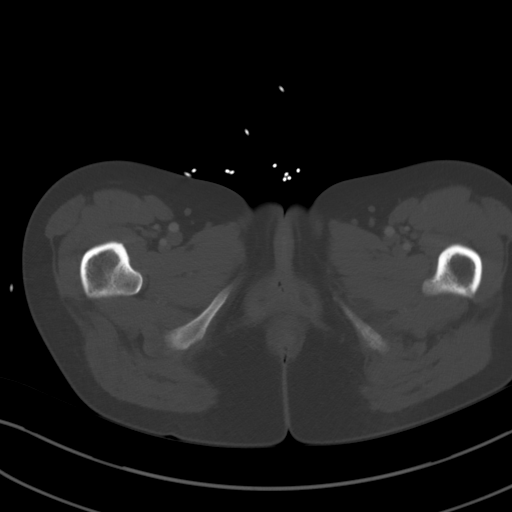
[im 12/98  soft-tissue]
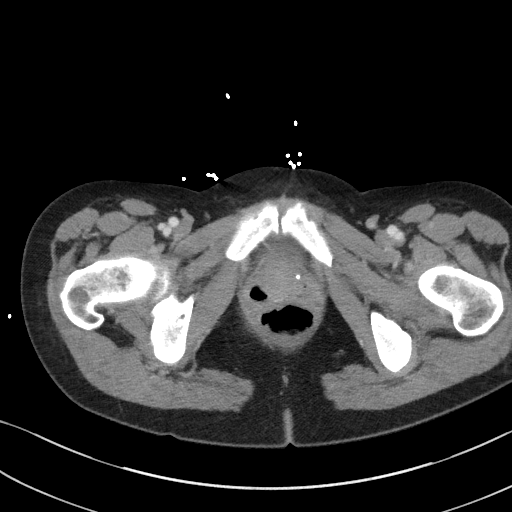
[im 20/98  soft-tissue]
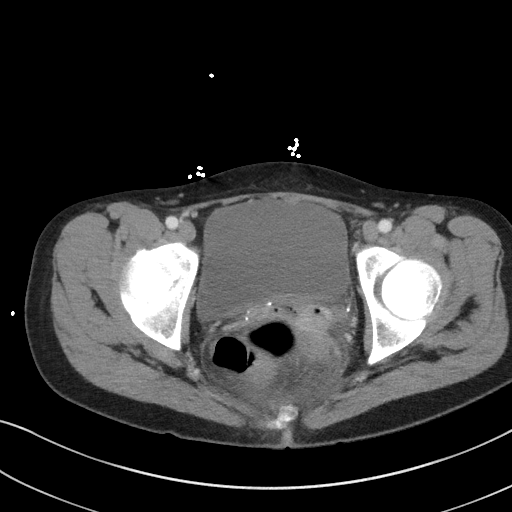
[im 28/98  soft-tissue]
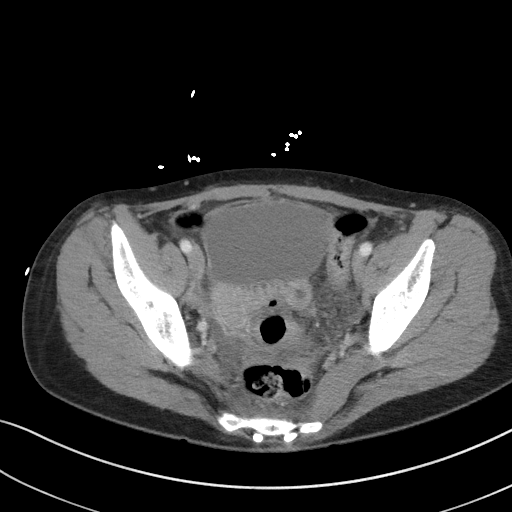
[im 35/98  soft-tissue]
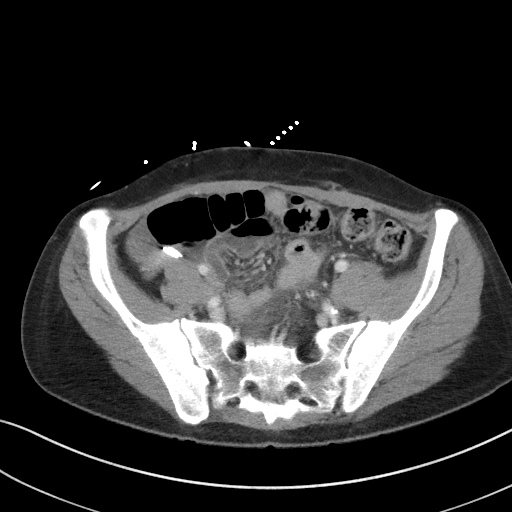
[im 43/98  soft-tissue]
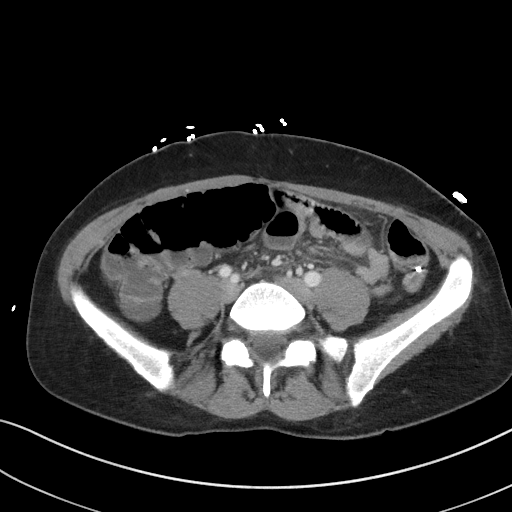
[im 51/98  soft-tissue]
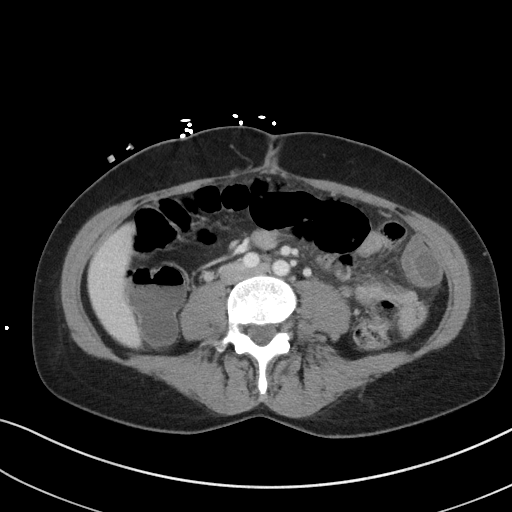
[im 55/98  soft-tissue]
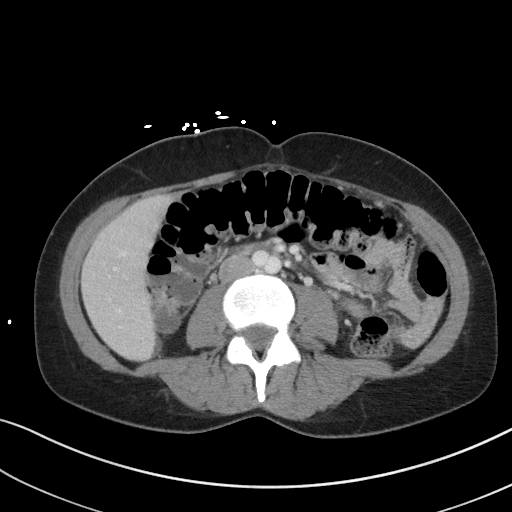
[im 63/98  soft-tissue]
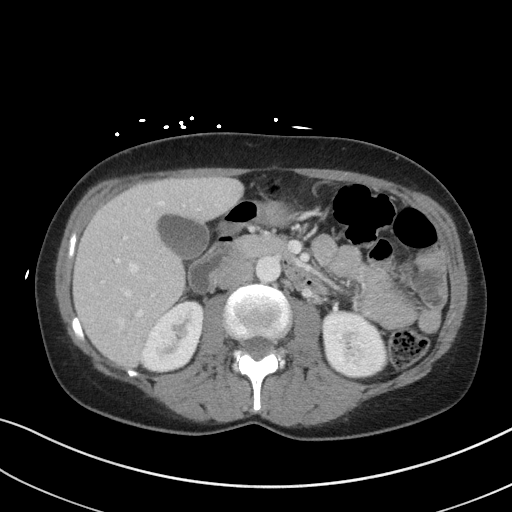
[im 63/98  bone]
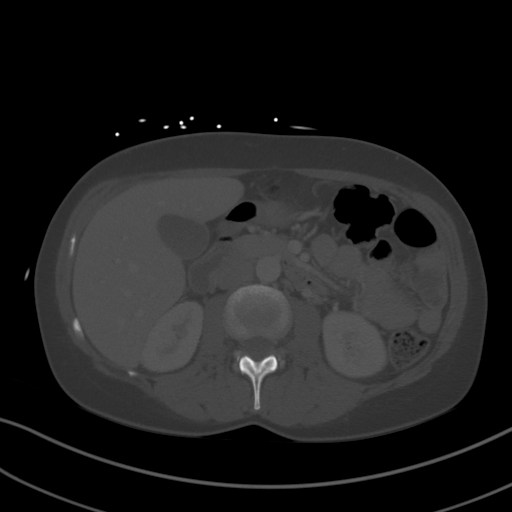
[im 70/98  soft-tissue]
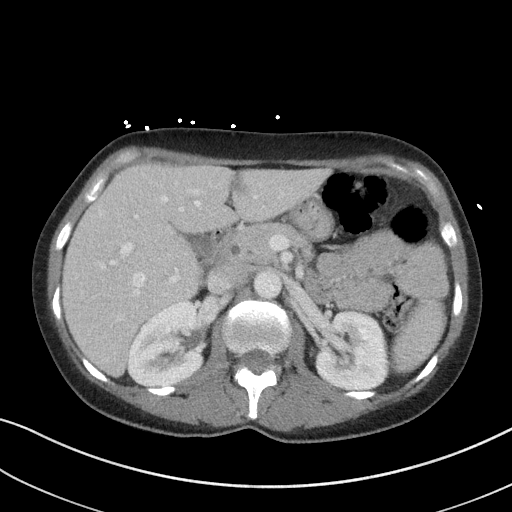
[im 78/98  soft-tissue]
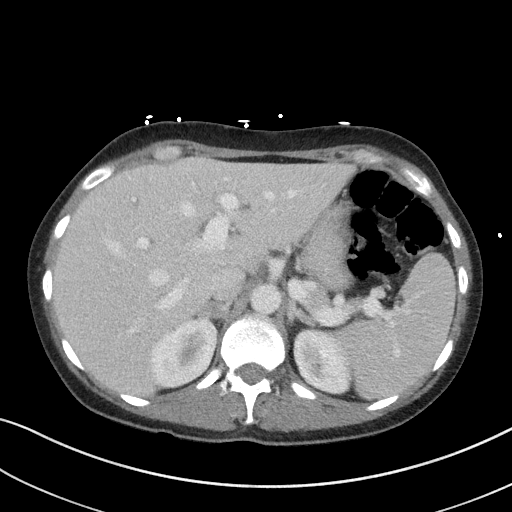
[im 86/98  soft-tissue]
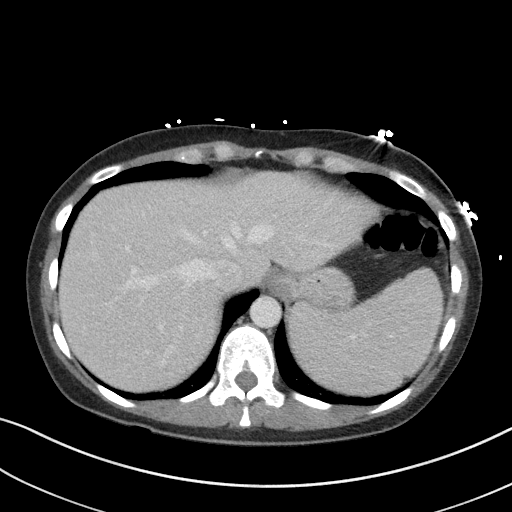
[im 94/98  soft-tissue]
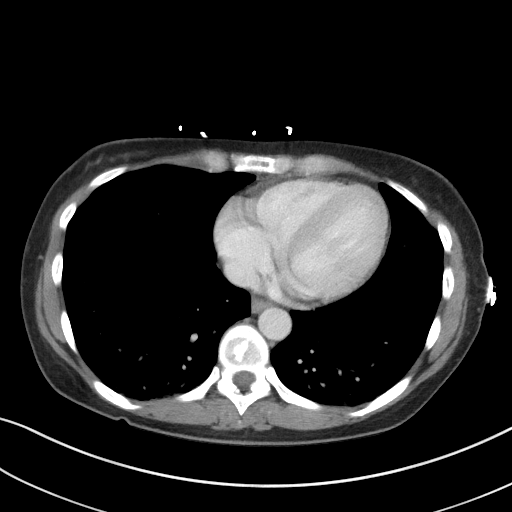

[Series 5: coronal st · coronal · 0.72mm/px · 3 of 75 slices shown]
[im 25/75  soft-tissue]
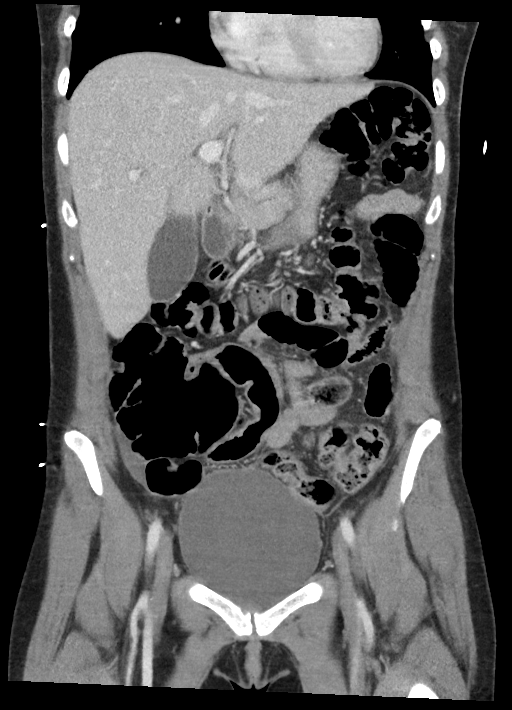
[im 33/75  soft-tissue]
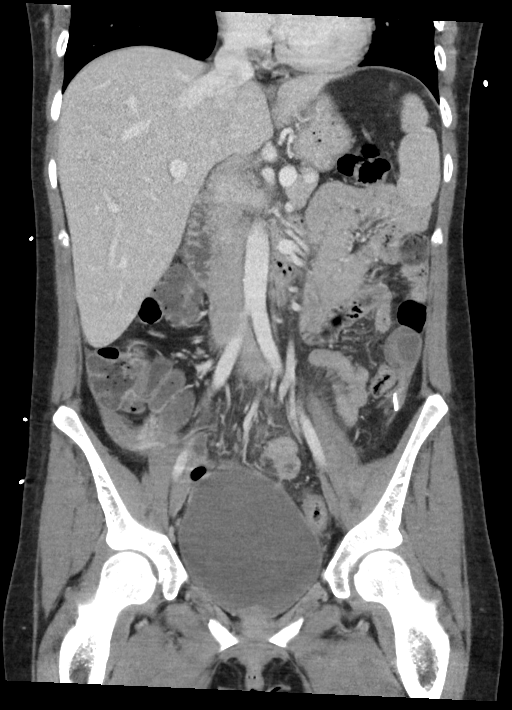
[im 42/75  soft-tissue]
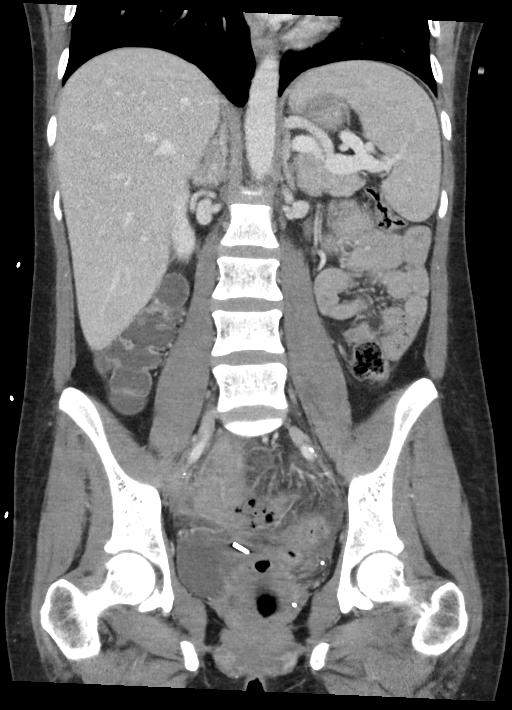

[16 of 46 positions shown; findings below may reference images not displayed]

FINDINGS: Lower chest: No acute abnormality.

Hepatobiliary: No focal liver abnormality is seen. No gallstones,
gallbladder wall thickening, or biliary dilatation.

Pancreas: Unremarkable. No pancreatic ductal dilatation or
surrounding inflammatory changes.

Spleen: Normal in size without focal abnormality.

Adrenals/Urinary Tract: Adrenal glands are unremarkable. Kidneys are
normal, without renal calculi, focal lesion, or hydronephrosis.
Bladder is unremarkable.

Stomach/Bowel: The stomach and the appendix appear normal. Wall
thickening and surrounding inflammatory changes are seen involving
the distal sigmoid colon concerning for infectious or inflammatory
colitis. There is seen wall thickening of adjacent small bowel loops
which may represent secondary inflammation. No evidence of bowel
obstruction is noted.

Vascular/Lymphatic: No significant vascular findings are present. No
enlarged abdominal or pelvic lymph nodes.

Reproductive: Status post hysterectomy. No adnexal masses.

Other: No abdominal wall hernia or abnormality. No abdominopelvic
ascites.

Musculoskeletal: No acute or significant osseous findings.
IMPRESSION: Findings consistent with infectious or inflammatory colitis of the
distal sigmoid colon, with wall thickening of adjacent small bowel
loops consistent with secondary inflammation.

## 2019-09-06 ENCOUNTER — Telehealth: Payer: Self-pay | Admitting: Pharmacy Technician

## 2019-09-06 NOTE — Telephone Encounter (Signed)
[facility] provided patient an initial 30 day supply of medication.  Patient failed to provide requested proof of income.  [facility] unable to provide additional medication assistance until patient provides requested financial documentation.  [facility]

## 2019-10-24 ENCOUNTER — Ambulatory Visit: Payer: Medicaid Other

## 2019-10-24 ENCOUNTER — Encounter: Payer: Self-pay | Admitting: *Deleted

## 2019-10-24 ENCOUNTER — Ambulatory Visit: Payer: Medicaid Other | Attending: Oncology | Admitting: *Deleted

## 2019-10-24 ENCOUNTER — Other Ambulatory Visit: Payer: Self-pay

## 2019-10-24 ENCOUNTER — Encounter (INDEPENDENT_AMBULATORY_CARE_PROVIDER_SITE_OTHER): Payer: Self-pay

## 2019-10-24 VITALS — BP 108/80 | HR 146 | Temp 98.0°F | Ht 69.0 in | Wt 143.3 lb

## 2019-10-24 DIAGNOSIS — N6452 Nipple discharge: Secondary | ICD-10-CM

## 2019-10-24 NOTE — Progress Notes (Signed)
  Subjective:     Patient ID: Claire Harris, female   DOB: [birth year], 44 y.o.   MRN: [id number]  HPI   Review of Systems     Objective:   Physical Exam Chest:     Breasts:        Right: Inverted nipple and nipple discharge present. No swelling, bleeding, mass, skin change or tenderness.        Left: Nipple discharge present. No inverted nipple, mass, skin change or tenderness.    Lymphadenopathy:     Upper Body:     Right upper body: No supraclavicular or axillary adenopathy.     Left upper body: No supraclavicular or axillary adenopathy.        Assessment:     44 year old White female presented to [facility] with complaints of bilateral nipple "infection".  Patient states she has had chronic nipple infections since she had her nipple piercing's removed about 10 years ago.  She states bilateral nipples have drained off and on for the past 10 years.  States she has noticed the most "infection" over the last few months with spontaneous drainage from the nipple and site of entry from the piercing.  On clinical breast exam I can visualize that the right nipple is half inverted with a scab like lesion at about 1:00 at the edge of the areola.  Patient states this is where the drainage occurs.  I can also visualize at 1:00 left breast an approximate dime sized faint pink area.  Both areas are thick to palpation.  There is no dominant mass, nipple drainage, or lymphadenopathy on exam today.  The patient states she just expressed some discharge before I came in.  I was not able to elicit any discharge on today's exam.  Patient has a history of hysterectomy for cervical cancer in 2015.  I can see in previous electronic record system she had a [id number].  Will try and find records on patient's last pap smear.   Patient has been screened for eligibility.  She does not have any insurance, Medicare or Medicaid.  She also meets financial eligibility.   Risk Assessment    Risk Scores      10/24/2019   Last  edited by: Orson Slick, CMA   5-year risk: 0.5 %   Lifetime risk: 8.2 %            Plan:     Will order bilateral diagnostic mammogram and ultrasound and surgical consult for treatment of possible infection.  Will get Claire Harris to call patient with her appointments.  Will follow up per [facility] protocol.

## 2019-10-24 NOTE — Patient Instructions (Signed)
Gave patient hand-out, Women Staying Healthy, Active and Well from [facility], with education on breast health, pap smears, heart and colon health. 

## 2019-11-02 ENCOUNTER — Ambulatory Visit
Admission: RE | Admit: 2019-11-02 | Discharge: 2019-11-02 | Disposition: A | Discharge status: Home / Self Care | Payer: Self-pay | Source: Ambulatory Visit | Attending: Oncology | Admitting: Oncology

## 2019-11-02 ENCOUNTER — Encounter: Payer: Self-pay | Admitting: *Deleted

## 2019-11-02 DIAGNOSIS — N6452 Nipple discharge: Secondary | ICD-10-CM

## 2019-11-02 NOTE — Progress Notes (Addendum)
Received called report from radiology that Dr. Rosana Hoes from mammography, wanted to notify us that patient needs antibiotic therapy for a skin infection.  I will send the message to Dr. Grayland Ormond to see if we can call in an antibiotic.  The mammogram report is not visible in Epic yet.    Verbal order received from Dr. Grayland Ormond for doxycline 100 mg by mouth BID for 7 days.  Tried to call patient to let her know I could call in the prescription, but no answer.  I left a message for her to return my call.  I also called her mom who is her listed contact, and left a message for patient to return my call.  Patient returned my call.  Prescription for Doxycyline 100 mg by mouth BID for 7 days called into [facility] on [address].  Per radiology recommendation patient has been scheduled to return to [facility] on 11/15/19 for repeat clinical breast exam and bilateral ultrasound.  Patient is aware of her appointment.

## 2019-11-07 ENCOUNTER — Encounter: Payer: Self-pay | Admitting: General Surgery

## 2019-11-07 ENCOUNTER — Ambulatory Visit (INDEPENDENT_AMBULATORY_CARE_PROVIDER_SITE_OTHER): Payer: Self-pay | Admitting: General Surgery

## 2019-11-07 ENCOUNTER — Other Ambulatory Visit: Payer: Self-pay

## 2019-11-07 VITALS — BP 118/83 | HR 74 | Temp 97.9°F | Ht 69.0 in | Wt 147.0 lb

## 2019-11-07 DIAGNOSIS — L03313 Cellulitis of chest wall: Secondary | ICD-10-CM

## 2019-11-07 NOTE — Patient Instructions (Signed)
Finish your antibiotic therapy and get your re imaging completed on 11/15/19.  Follow-up with our office as needed.  Please call and ask to speak with a nurse if you develop questions or concerns.

## 2019-11-07 NOTE — Progress Notes (Signed)
Patient ID: Claire Harris, female   DOB: 04/05/1976, 44 y.o.   MRN: [id number]  Chief Complaint  Patient presents with  . New Patient (Initial Visit)    breast infection    HPI Steven Placeres is a 44 y.o. female.   She was referred by Tanya Nones from the breast care center for surgical evaluation of possible breast infection.  Ms. Goostree states that ever since she removed her bilateral nipple piercings 10 years ago, she has had drainage from the area.  She says that she thinks that food falling into her bra when she eats may be responsible.  She is somewhat scattered in her train of thought and can be difficult to reorient.  She says that she has "nasty infections" in both nipples, but is unable to quantify or qualify what sorts of symptoms she is having, such as redness or the nature of any drainage.  She says that her nipple on the right has been inverted ever since she removed her nipple piercing.  She underwent bilateral mammography and ultrasonography last week.  No concerning lesions or abscess was appreciated.  There was some findings suggestive of chronic scar tissue, potentially from her nipple piercings.  She was placed on a course of antibiotics.  I had messaged [name] last week to see if Ms. Pilgreen actually needed to see me today, as she does have follow-up in the breast care center on 11/15/2019.  I did not receive a response.  Indeed, Ms. Guiffre questions whether or not she actually needed to be seen here today as well.  She denies any other problems with her breasts.  She has never had a breast biopsy.  She denies ever having used oral contraceptive pills or hormone replacement therapy.  She did have a hysterectomy in 2018, but still has her ovaries.  She denies any family history of breast cancer.  Menarche was at age 54.  She had 4 pregnancies, the first of which was at age 89.  She did not breast-feed.   Past Medical History:  Diagnosis Date  . Cervical cancer ([place]) 2018   cervical  . Hx of hepatitis C     Past Surgical History:  Procedure Laterality Date  . ABDOMINAL HYSTERECTOMY  2018   partial/radical  . arm surgery Left 2012    History reviewed. No pertinent family history.  Social History Social History   Tobacco Use  . Smoking status: Current Every Day Smoker    Packs/day: 1.00    Types: Cigarettes  . Smokeless tobacco: Never Used  Substance Use Topics  . Alcohol use: Not Currently  . Drug use: Not Currently    No Known Allergies  Current Outpatient Medications  Medication Sig Dispense Refill  . doxycycline (VIBRAMYCIN) 100 MG capsule Take 100 mg by mouth 2 (two) times daily.     No current facility-administered medications for this visit.    Review of Systems Review of Systems  All other systems reviewed and are negative.   Blood pressure 118/83, pulse 74, temperature 97.9 F (36.6 C), temperature source Temporal, height 5\' 9"  (1.753 m), weight 147 lb (66.7 kg), SpO2 98 %. Body mass index is 21.71 kg/m.  Physical Exam Physical Exam Vitals reviewed. Exam conducted with a chaperone present.  Constitutional:      General: She is not in acute distress.    Appearance: She is normal weight.  HENT:     Head: Normocephalic and atraumatic.     Nose:  Comments: Covered with a mask secondary to COVID-19 precautions    Mouth/Throat:     Comments: Covered with a mask secondary to COVID-19 precautions Eyes:     General:        Right eye: No discharge.        Left eye: No discharge.     Conjunctiva/sclera: Conjunctivae normal.  Cardiovascular:     Rate and Rhythm: Normal rate and regular rhythm.  Pulmonary:     Effort: Pulmonary effort is normal. No respiratory distress.  Chest:     Breasts:        Right: Inverted nipple present. No nipple discharge.        Left: No inverted nipple or nipple discharge.       Comments: The tracts from her prior nipple piercings are visible.  She has a small skin lesion in an identical  location adjacent to the nipple on each breast they are small, slightly indurated, and the one on the left looks like it may have a small head forming beneath the surface.  No fluctuance or drainage appreciated.  There is no discharge from either nipple. Genitourinary:    Comments: Deferred Musculoskeletal:     Cervical back: Normal range of motion. No rigidity or tenderness.     Comments: Multiple scars on her bilateral forearms.  Skin:    Coloration: Skin is not jaundiced.  Neurological:     General: No focal deficit present.     Mental Status: She is alert.  Psychiatric:        Mood and Affect: Mood normal.        Judgment: Judgment normal.     Data Reviewed I reviewed the radiologic imaging and findings.  I concur with the radiologist's impression, copied here:  CLINICAL DATA:  Patient reports intermittent thickening and infections of the bilateral nipples ever since removing nipple piercings 10 years prior.  EXAM: DIGITAL DIAGNOSTIC BILATERAL MAMMOGRAM WITH CAD AND TOMO  ULTRASOUND BILATERAL BREAST  COMPARISON:  None.  ACR Breast Density Category c: The breast tissue is heterogeneously dense, which may obscure small masses.  FINDINGS: No concerning masses, calcifications or focal distortion identified within the right or left breast. Dense tissue is demonstrated within the retroareolar breast bilaterally. Questioned asymmetry within the medial right breast resolved with additional imaging.  Mammographic images were processed with CAD.  On physical exam, there is a focal area of cutaneous redness involving the outer periareolar left breast. Additionally, there is a red scar involving the medial periareolar right breast.  Targeted ultrasound is performed, showing a 4 x 18 x 17 mm predominately intracutaneous hypoechoic masslike structure left breast 1 o'clock position 1 cm from the nipple at the site of overlying cutaneous redness.  Within the right  breast 1 o'clock position 1 cm from the nipple there is a 10 x 4 mm focal area of hypoechogenicity within the skin at the site of overlying cutaneous redness.  No discrete retroareolar mass identified within the right or left breast.  IMPRESSION: Redness involving the outer periareolar left breast and inner periareolar right breast may represent acute infectious process. No evidence for underlying abscess.  Underlying hypoechogenicity at the site of cutaneous redness within the right and left breast may represent chronic scarring or potentially fistulous tract formation from chronic infectious process.  RECOMMENDATION: Antibiotic therapy for 10 days to treat the possible skin infection/cellulitis.  Recommend repeat physical exam and bilateral ultrasound in 2-3 weeks to reassess and ensure clinical resolution.  If  the findings persist, consider bilateral breast MRI.  I have discussed the findings and recommendations with the patient. If applicable, a reminder letter will be sent to the patient regarding the next appointment.  Assessment This patient seems to have chronic issues related to old nipple piercings.  Certainly, this has been seen in other piercing sites wherein a sinus tract develops in the patient's shed skin cells which can be malodorous and have the appearance of pus.  The current lesions in question do not appear intimately related to the actual sites of the piercing but are adjacent.  There is no drainable abscess identified, nor are there any lesions that would suggest surgical excision is necessary.  Plan She should complete her current course of antibiotics and follow-up with the breast care center as scheduled on 15 Nov 2019.  I do not think she has any surgical indications at this time.  She may see [place] on an as-needed basis.    Fredirick Maudlin 11/07/2019, 11:56 AM

## 2019-11-15 ENCOUNTER — Ambulatory Visit: Payer: Self-pay

## 2019-11-15 ENCOUNTER — Encounter: Payer: Self-pay | Admitting: *Deleted

## 2019-11-15 NOTE — Progress Notes (Signed)
Patient no-showed for follow up appointment this morning.  I have called and left her a message that I could still see her today if she could be here within about 30 minutes, otherwise she will need to reschedule.

## 2019-11-29 ENCOUNTER — Ambulatory Visit: Payer: Self-pay | Attending: Oncology

## 2019-11-30 ENCOUNTER — Encounter: Payer: Self-pay | Admitting: *Deleted

## 2019-11-30 ENCOUNTER — Telehealth: Payer: Self-pay | Admitting: *Deleted

## 2019-11-30 NOTE — Progress Notes (Signed)
Patient no-showed her appointment yesterday.  This is the 2nd appointment she has missed.  I have sent Jeanella Anton a message to reschedule her again.

## 2019-12-27 ENCOUNTER — Other Ambulatory Visit: Payer: Self-pay | Admitting: *Deleted

## 2019-12-27 DIAGNOSIS — N611 Abscess of the breast and nipple: Secondary | ICD-10-CM

## 2020-01-10 ENCOUNTER — Encounter: Payer: Self-pay | Admitting: *Deleted

## 2020-01-10 NOTE — Progress Notes (Signed)
Certified letter mailed to patient.  She has not returned for follow up imaging or clinical exam of her bilateral breast abscess's.

## 2020-01-19 ENCOUNTER — Encounter: Payer: Self-pay | Admitting: *Deleted

## 2020-01-19 NOTE — Progress Notes (Signed)
Patient has not responded to numerous phone calls in regards to scheduling her follow up mammogram and ultrasound.  She has not responded to the certified letter I mailed on 01/10/20.  Will close case as refusal to follow up.

## 2020-06-26 ENCOUNTER — Ambulatory Visit
Admission: RE | Admit: 2020-06-26 | Discharge: 2020-06-26 | Disposition: A | Discharge status: Home / Self Care | Payer: Self-pay | Source: Ambulatory Visit | Attending: Oncology | Admitting: Oncology

## 2020-06-26 ENCOUNTER — Other Ambulatory Visit: Payer: Self-pay | Admitting: *Deleted

## 2020-06-26 ENCOUNTER — Other Ambulatory Visit: Payer: Self-pay

## 2020-06-26 ENCOUNTER — Encounter: Payer: Self-pay | Admitting: *Deleted

## 2020-06-26 ENCOUNTER — Ambulatory Visit: Payer: Self-pay | Attending: Oncology | Admitting: *Deleted

## 2020-06-26 VITALS — BP 115/83 | HR 81 | Temp 97.6°F | Ht 68.0 in | Wt 153.9 lb

## 2020-06-26 DIAGNOSIS — N611 Abscess of the breast and nipple: Secondary | ICD-10-CM

## 2020-06-26 DIAGNOSIS — N6489 Other specified disorders of breast: Secondary | ICD-10-CM

## 2020-06-26 DIAGNOSIS — N6459 Other signs and symptoms in breast: Secondary | ICD-10-CM

## 2020-06-26 DIAGNOSIS — Z0000 Encounter for general adult medical examination without abnormal findings: Secondary | ICD-10-CM | POA: Insufficient documentation

## 2020-06-26 DIAGNOSIS — N61 Mastitis without abscess: Secondary | ICD-10-CM

## 2020-06-26 HISTORY — DX: Unspecified lump in unspecified breast: N63.0

## 2020-06-26 HISTORY — DX: Other signs and symptoms in breast: N64.59

## 2020-06-26 NOTE — Patient Instructions (Signed)
Gave patient hand-out, Women Staying Healthy, Active and Well from [facility], with education on breast health, pap smears, heart and colon health. 

## 2020-06-26 NOTE — Progress Notes (Signed)
  Subjective:     Patient ID: Claire Harris, female   DOB: February 11, 1976, 44 y.o.   MRN: [id number]  HPI   Review of Systems     Objective:   Physical Exam Chest:  Breasts:     Right: Bleeding, inverted nipple and skin change present. No swelling, mass, nipple discharge, tenderness, axillary adenopathy or supraclavicular adenopathy.     Left: Skin change present. No swelling, bleeding, inverted nipple, mass, nipple discharge, tenderness, axillary adenopathy or supraclavicular adenopathy.        Comments: Thickened right nipple Abdominal:     Palpations: There is no hepatomegaly or splenomegaly.    Genitourinary:    Exam position: Lithotomy position.     Labia:        Right: No rash, tenderness, lesion or injury.        Left: No rash, tenderness, lesion or injury.      Vagina: Vaginal discharge present.     Comments: White non-odorous discharge noted - history of hysterectomy for cervical cancer in 2018 Lymphadenopathy:     Upper Body:     Right upper body: No supraclavicular or axillary adenopathy.     Left upper body: No supraclavicular or axillary adenopathy.        Assessment:     44 year old returns to [facility] for follow up from bilateral breasts abscess in May 2021.  Patient was treated with antibiotics and seen by Dr. Lady Gary for surgical consult.  It was recommended that she return for repeat clinical breast exam and mammogram in 3 weeks, but patient no-showed and did not respond to multiple attempts to reschedule.  She states today that the bilateral breast abscesses are still draining.  On clinical breast exam there is one area at 4:00 right breast, that is red, scabbed and draining serous sanguineous drainage when I palpate.  There are 2 areas in the left breast at 2:00 and 4:00 that are red and scabbed.  There is no drainage on palpation, but the patient states there is constant drainage.  Taught self breast awareness.  Patient has a history of cervical cancer in 2018.   She had a hysterectomy at [facility].  Due to history of cervical cancer a specimen was collected for pap smear.  Patient has been screened for eligibility.  She does not have any insurance, Medicare or Medicaid.  She also meets financial eligibility.   Risk Assessment   No risk assessment data for the current encounter  Risk Scores      10/24/2019   Last edited by: Alta Corning, CMA   5-year risk: 0.5 %   Lifetime risk: 8.2 %             Plan:     Bilateral diagnostic mammogram and ultrasound ordered per radiology recommendation.  Specimen for pap sent to the lab.  Discussed importance of follow up and continuity of care with patient.  States she will follow up as indicated.  Discussed that if the radiologist does not prescribe antibiotics then I will refer her back to Dr. Lady Gary for further evaluation and possible treatment.

## 2020-07-01 ENCOUNTER — Encounter: Payer: Self-pay | Admitting: *Deleted

## 2020-07-01 LAB — IGP, APTIMA HPV: HPV Aptima: NEGATIVE

## 2020-07-01 NOTE — Progress Notes (Signed)
Called patient to see if she was given a prescription for antibiotics for her abscess.  Left message for her to retun my call.

## 2020-07-04 ENCOUNTER — Ambulatory Visit: Payer: Medicaid Other

## 2020-07-04 ENCOUNTER — Telehealth: Payer: Self-pay | Admitting: *Deleted

## 2020-07-04 NOTE — Telephone Encounter (Signed)
Per Dr. Kerby Nora PT should complete antibiotic prior to stereo bx.  Called pt multiple times to discuss cancelling bx appt.  Also sent pt a mychart message.  No response from pt.  Pt did not arrive for 8:30am bx appt.

## 2020-07-05 ENCOUNTER — Encounter: Payer: Self-pay | Admitting: *Deleted

## 2020-07-05 NOTE — Progress Notes (Signed)
Called patient again and requested she return my call.  I would like to send her to Dr. Celine Ahr for treatment of her abscesses.  If she does not respond, I wil send a certified letter next week.

## 2020-07-09 ENCOUNTER — Encounter: Payer: Self-pay | Admitting: *Deleted

## 2020-07-23 ENCOUNTER — Encounter: Payer: Self-pay | Admitting: *Deleted

## 2020-07-23 NOTE — Progress Notes (Signed)
Letter mailed to patient on 07/09/20 to inform patient of the need for treatment of her breast abscess.  I have had no response from the patient.  Tried to call her again today and the number we have on file will not go through.  Will close case and refusal to follow up.

## 2020-12-31 ENCOUNTER — Encounter: Payer: Self-pay | Admitting: *Deleted

## 2020-12-31 NOTE — Progress Notes (Signed)
Patient called today and wants to get her breast biopsy.  States she was incarcerated and that's why she did not respond to my calls.  That was six months ago.  I have sent [name] at [facility] a  message to ask the rad if we proceed with biopsy or do they want another mammogram and ultrasound first.  Patient states she had a mammogram while incarcerated.  I have no imaging results.  I can see that a referral was made for her to [facility].  Informed patient I will call her back when I have information from radiology.

## 2021-01-07 NOTE — Progress Notes (Signed)
Talked with [name] at the [facility].  She states per the radiologist patient will need to have another mammogram and ultrasound prior to biopsy since it has been over 6 months.  I have left the patient a message to return my call.

## 2021-01-29 ENCOUNTER — Ambulatory Visit: Payer: Medicaid Other | Attending: Oncology | Admitting: *Deleted

## 2021-01-29 ENCOUNTER — Other Ambulatory Visit: Payer: Self-pay | Admitting: *Deleted

## 2021-01-29 ENCOUNTER — Other Ambulatory Visit: Payer: Self-pay

## 2021-01-29 ENCOUNTER — Encounter: Payer: Self-pay | Admitting: *Deleted

## 2021-01-29 ENCOUNTER — Ambulatory Visit
Admission: RE | Admit: 2021-01-29 | Discharge: 2021-01-29 | Disposition: A | Discharge status: Home / Self Care | Payer: Self-pay | Source: Ambulatory Visit | Attending: Oncology | Admitting: Oncology

## 2021-01-29 ENCOUNTER — Ambulatory Visit
Admission: RE | Admit: 2021-01-29 | Discharge: 2021-01-29 | Disposition: A | Discharge status: Home / Self Care | Payer: Medicaid Other | Source: Ambulatory Visit | Attending: Oncology | Admitting: Oncology

## 2021-01-29 VITALS — BP 134/93 | HR 76 | Temp 98.1°F | Ht 68.0 in | Wt 177.0 lb

## 2021-01-29 DIAGNOSIS — L988 Other specified disorders of the skin and subcutaneous tissue: Secondary | ICD-10-CM

## 2021-01-29 DIAGNOSIS — N649 Disorder of breast, unspecified: Secondary | ICD-10-CM

## 2021-01-29 NOTE — Progress Notes (Signed)
  Subjective:     Patient ID: Claire Harris, female   DOB: 02/15/1976, 45 y.o.   MRN: [id number]  HPI   Review of Systems     Objective:   Physical Exam     Assessment:     See notes    Plan:     See notes

## 2021-01-29 NOTE — Patient Instructions (Signed)
Gave patient hand-out, Women Staying Healthy, Active and Well from [facility], with education on breast health, pap smears, heart and colon health. 

## 2021-01-29 NOTE — Progress Notes (Signed)
Subjective:     Patient ID: Claire Harris, female   DOB: 06/26/1976, 45 y.o.   MRN: [id number]  HPI  BCCCP Medical History Record - 01/29/21 [id number]       Breast History   Screening cycle Rescreen    CBE Date 12/04/20    Provider (CBE) In prison    Initial Mammogram 01/29/21    Last Mammogram Annual    Last Mammogram Date 06/26/20    Provider (Mammogram)  [facility]    Recent Breast Symptoms Lump   right breast - with drainage     Breast Cancer History   Breast Cancer History No personal or family history      Previous History of Breast Problems   Breast Surgery or Biopsy None    Breast Implants N/A    BSE Done Monthly      Gynecological/Obstetrical History   LMP --   hysterectomy 2018   Is there any chance that the client could be pregnant?  No    Age at menarche 89    Date of last PAP  10/09/20    Provider (PAP) prison    Age at first live birth 3    Breast fed children No    DES Exposure Unkown    Cervical, Uterine or Ovarian cancer Yes   patient had cervical cancer and had a hysterectomy   Family history of Cervial, Uterine or Ovarian cancer No    Hysterectomy Yes    Cervix removed Yes    Ovaries removed No    Laser/Cryosurgery Yes    Current method of birth control None    Current method of Estrogen/Hormone replacement None    Smoking history Yes    Comments refused smoking cessation info               Review of Systems     Objective:   Physical Exam Chest:  Breasts:    Right: Inverted nipple and skin change present. No swelling, bleeding, mass, nipple discharge, tenderness, axillary adenopathy or supraclavicular adenopathy.     Left: No swelling, bleeding, inverted nipple, mass, nipple discharge, skin change, tenderness, axillary adenopathy or supraclavicular adenopathy.    Lymphadenopathy:     Upper Body:     Right upper body: No supraclavicular or axillary adenopathy.     Left upper body: No supraclavicular or axillary adenopathy.       Assessment:     Patient returns to [facility] for follow up of her December 21 imaging.  Patient states she did not follow up due to being incarcerated.  On clinical breast exam today she has a scar on the left breast at 3:00 from a healed lesion.  On the right breast there are 2 red scabbed lesions at 3 and 4:00 at the edge of the areola.  The nipple and areola is thick and retracted.  There is no dominant mass, nipple discharge, or lymphadenopathy.  There is no drainage from the lesions on today's exam, although patient states the lesions constantly drain.  Taught self breast awareness. Patient has been screened for eligibility.  She does not have any insurance, Medicare or Medicaid.  She also meets financial eligibility.   Risk Assessment     Risk Scores       01/29/2021 10/24/2019   Last edited by: Rico Junker, RN [place], Laddie Aquas, CMA   5-year risk: 0.5 % 0.5 %   Lifetime risk: 8.1 % 8.2 %  Plan:     Will get diagnostic mammogram and ultrasound.  Will follow up per BCCCP protocol.

## 2021-02-04 ENCOUNTER — Ambulatory Visit
Admission: RE | Admit: 2021-02-04 | Discharge: 2021-02-04 | Disposition: A | Discharge status: Home / Self Care | Payer: Self-pay | Source: Ambulatory Visit | Attending: Oncology | Admitting: Oncology

## 2021-02-04 ENCOUNTER — Other Ambulatory Visit: Payer: Self-pay

## 2021-02-04 DIAGNOSIS — N6489 Other specified disorders of breast: Secondary | ICD-10-CM

## 2021-02-04 HISTORY — PX: BREAST BIOPSY: SHX20

## 2021-02-05 LAB — SURGICAL PATHOLOGY

## 2021-02-06 ENCOUNTER — Encounter: Payer: Self-pay | Admitting: *Deleted

## 2021-02-06 NOTE — Progress Notes (Signed)
Received message from Electa Sniff, RN that patient had been notified of her benign breast biopsy and need for surgical consultation.  Patient has been scheduled to see Dr. Celine Ahr on 02/11/21.  Patient aware of her appointment.

## 2021-02-11 ENCOUNTER — Ambulatory Visit: Payer: Self-pay | Admitting: General Surgery

## 2021-02-20 ENCOUNTER — Other Ambulatory Visit: Payer: Self-pay

## 2021-02-20 ENCOUNTER — Encounter: Payer: Self-pay | Admitting: General Surgery

## 2021-02-20 ENCOUNTER — Ambulatory Visit (INDEPENDENT_AMBULATORY_CARE_PROVIDER_SITE_OTHER): Payer: Self-pay | Admitting: General Surgery

## 2021-02-20 VITALS — BP 145/118 | HR 86 | Temp 98.4°F | Ht 68.0 in | Wt 176.0 lb

## 2021-02-20 DIAGNOSIS — N649 Disorder of breast, unspecified: Secondary | ICD-10-CM
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
## 2021-02-20 NOTE — Patient Instructions (Addendum)
Our surgery scheduler will call you within 24-48 hours to schedule your surgery.Please have the [place] surgery sheet available when speaking with her.  Be sure to wear the breast binder that will be provided after your surgery or a tight sports bra.

## 2021-02-21 ENCOUNTER — Telehealth: Payer: Self-pay | Admitting: General Surgery

## 2021-02-21 NOTE — Telephone Encounter (Signed)
Left message for patient to call regarding scheduling for surgery.

## 2021-02-24 ENCOUNTER — Telehealth: Payer: Self-pay | Admitting: General Surgery

## 2021-02-24 NOTE — Telephone Encounter (Signed)
Patient has been advised of Pre-Admission date/time, COVID Testing date and Surgery date.  Surgery Date: 03/17/21 Preadmission Testing Date: 03/10/21 (phone 8a-1p) Covid Testing Date: Not needed.    Patient has been made aware to call [phone], between 1-3:00pm the day before surgery, to find out what time to arrive for surgery.

## 2021-03-10 ENCOUNTER — Other Ambulatory Visit
Admission: RE | Admit: 2021-03-10 | Discharge: 2021-03-10 | Disposition: A | Discharge status: Home / Self Care | Payer: Medicaid Other | Source: Ambulatory Visit | Attending: General Surgery | Admitting: General Surgery

## 2021-03-10 ENCOUNTER — Other Ambulatory Visit: Payer: Self-pay

## 2021-03-10 HISTORY — DX: Other reaction to spinal and lumbar puncture: G97.1

## 2021-03-10 NOTE — Patient Instructions (Signed)
Your procedure is scheduled on: Monday March 17, 2021. Report to Day Surgery inside [facility] 2nd floor stop by admissions desk first before getting on elevator. To find out your arrival time please call [phone] between 1PM - 3PM on Friday March 14, 2021.  Remember: Instructions that are not followed completely may result in serious medical risk,  up to and including death, or upon the discretion of your surgeon and anesthesiologist your  surgery may need to be rescheduled.     _X__ 1. Do not eat food after midnight the night before your procedure.                 No chewing gum or hard candies. You may drink clear liquids up to 2 hours                 before you are scheduled to arrive for your surgery- DO not drink clear                 liquids within 2 hours of the start of your surgery.                 Clear Liquids include:  water, apple juice without pulp, clear Gatorade, G2 or                  Gatorade Zero (avoid Red/Purple/Blue), Black Coffee or Tea (Do not add                 anything to coffee or tea).  __X__2.  On the morning of surgery brush your teeth with toothpaste and water, you                may rinse your mouth with mouthwash if you wish.  Do not swallow any toothpaste of mouthwash.     _X__ 3.  No Alcohol for 24 hours before or after surgery.   _X__ 4.  Do Not Smoke or use e-cigarettes For 24 Hours Prior to Your Surgery.                 Do not use any chewable tobacco products for at least 6 hours prior to                 Surgery.  _X__  5.  Do not use any recreational drugs (marijuana, cocaine, heroin, ecstasy, MDMA or other)                For at least one week prior to your surgery.  Combination of these drugs with anesthesia                May have life threatening results.   __X__ 6  Notify your doctor if there is any change in your medical condition      (cold, fever, infections).     Do not wear jewelry, make-up,  hairpins, clips or nail polish. Do not wear lotions, powders, or perfumes. You may wear deodorant. Do not shave 48 hours prior to surgery.  Do not bring valuables to the hospital.    [facility] is not responsible for any belongings or valuables.  Contacts, dentures or bridgework may not be worn into surgery. Leave your suitcase in the car. After surgery it may be brought to your room. For patients admitted to the hospital, discharge time is determined by your treatment team.   Patients discharged the day of surgery will not be allowed to drive home.   Make  arrangements for someone to be with you for the first 24 hours of your Same Day Discharge.    __X__ Take these medicines the morning of surgery with A SIP OF WATER:    1. None   2.   3.   4.  5.  6.  ____ Fleet Enema (as directed)   __X__ Use CHG Soap (or wipes) as directed  ____ Use Benzoyl Peroxide Gel as instructed  ____ Use inhalers on the day of surgery  ____ Stop metformin 2 days prior to surgery    ____ Take 1/2 of usual insulin dose the night before surgery. No insulin the morning          of surgery.   ____ Call your PCP, cardiologist, or Pulmonologist if taking Coumadin/Plavix/aspirin and ask when to stop before your surgery.   __X__ One Week prior to surgery- Stop Anti-inflammatories such as Ibuprofen, Aleve, Advil, Motrin, meloxicam (MOBIC), diclofenac, etodolac, ketorolac, Toradol, Daypro, piroxicam, Goody's or BC powders. OK TO USE TYLENOL IF NEEDED   __X__ Stop supplements until after surgery.    ____ Bring C-Pap to the hospital.    If you have any questions regarding your pre-procedure instructions,  Please call Pre-admit Testing at [phone].

## 2021-03-17 ENCOUNTER — Encounter (HOSPITAL_COMMUNITY): Payer: Self-pay | Admitting: Anesthesiology

## 2021-03-17 ENCOUNTER — Encounter
Admission: RE | Disposition: A | Discharge status: Home / Self Care | Payer: Self-pay | Source: Home / Self Care | Attending: General Surgery

## 2021-03-17 ENCOUNTER — Other Ambulatory Visit: Payer: Self-pay

## 2021-03-17 ENCOUNTER — Ambulatory Visit
Admission: RE | Admit: 2021-03-17 | Discharge: 2021-03-17 | Disposition: A | Discharge status: Home / Self Care | Payer: Medicaid Other | Attending: General Surgery | Admitting: General Surgery

## 2021-03-17 ENCOUNTER — Encounter: Payer: Self-pay | Admitting: General Surgery

## 2021-03-17 DIAGNOSIS — N649 Disorder of breast, unspecified: Secondary | ICD-10-CM

## 2021-03-17 DIAGNOSIS — Z538 Procedure and treatment not carried out for other reasons: Secondary | ICD-10-CM | POA: Insufficient documentation

## 2021-03-17 DIAGNOSIS — N6120 Granulomatous mastitis, unspecified breast: Secondary | ICD-10-CM | POA: Insufficient documentation

## 2021-03-17 LAB — URINE DRUG SCREEN, QUALITATIVE (ARMC ONLY)
Amphetamines, Ur Screen: NOT DETECTED
Barbiturates, Ur Screen: NOT DETECTED
Benzodiazepine, Ur Scrn: NOT DETECTED
Cannabinoid 50 Ng, Ur ~~LOC~~: NOT DETECTED
Cocaine Metabolite,Ur ~~LOC~~: NOT DETECTED
MDMA (Ecstasy)Ur Screen: NOT DETECTED
Methadone Scn, Ur: NOT DETECTED
Opiate, Ur Screen: NOT DETECTED
Phencyclidine (PCP) Ur S: NOT DETECTED
Tricyclic, Ur Screen: NOT DETECTED

## 2021-03-17 SURGERY — EXCISION OF BREAST BIOPSY
Anesthesia: Choice

## 2021-03-17 MED ORDER — ACETAMINOPHEN 500 MG PO TABS: 1000.0000 mg | ORAL_TABLET | ORAL | Status: AC

## 2021-03-17 MED ORDER — CHLORHEXIDINE GLUCONATE CLOTH 2 % EX PADS
6.0000 | MEDICATED_PAD | Freq: Once | CUTANEOUS | Status: AC
  Administered 2021-03-17: 6 via TOPICAL

## 2021-03-17 MED ORDER — LACTATED RINGERS IV SOLN: INTRAVENOUS | Status: DC

## 2021-03-17 MED ORDER — FAMOTIDINE 20 MG PO TABS
ORAL_TABLET | ORAL | Status: AC
  Administered 2021-03-17: 20 mg via ORAL
  Filled 2021-03-17: qty 1

## 2021-03-17 MED ORDER — GABAPENTIN 300 MG PO CAPS: 300.0000 mg | ORAL_CAPSULE | ORAL | Status: AC

## 2021-03-17 MED ORDER — ACETAMINOPHEN 500 MG PO TABS
ORAL_TABLET | ORAL | Status: AC
  Administered 2021-03-17: 1000 mg via ORAL
  Filled 2021-03-17: qty 2

## 2021-03-17 MED ORDER — CELECOXIB 200 MG PO CAPS
ORAL_CAPSULE | ORAL | Status: AC
  Administered 2021-03-17: 200 mg via ORAL
  Filled 2021-03-17: qty 1

## 2021-03-17 MED ORDER — ORAL CARE MOUTH RINSE: 15.0000 mL | Freq: Once | OROMUCOSAL | Status: AC

## 2021-03-17 MED ORDER — FAMOTIDINE 20 MG PO TABS: 20.0000 mg | ORAL_TABLET | Freq: Once | ORAL | Status: AC

## 2021-03-17 MED ORDER — CELECOXIB 200 MG PO CAPS: 200.0000 mg | ORAL_CAPSULE | ORAL | Status: AC

## 2021-03-17 MED ORDER — CHLORHEXIDINE GLUCONATE 0.12 % MT SOLN: 15.0000 mL | Freq: Once | OROMUCOSAL | Status: AC

## 2021-03-17 MED ORDER — GABAPENTIN 300 MG PO CAPS
ORAL_CAPSULE | ORAL | Status: AC
  Administered 2021-03-17: 300 mg via ORAL
  Filled 2021-03-17: qty 1

## 2021-03-17 MED ORDER — CEFAZOLIN SODIUM-DEXTROSE 2-4 GM/100ML-% IV SOLN
INTRAVENOUS | Status: AC
  Filled 2021-03-17: qty 100

## 2021-03-17 MED ORDER — CEFAZOLIN SODIUM-DEXTROSE 2-4 GM/100ML-% IV SOLN: 2.0000 g | INTRAVENOUS | Status: DC

## 2021-03-17 MED ORDER — CHLORHEXIDINE GLUCONATE 0.12 % MT SOLN
OROMUCOSAL | Status: AC
  Administered 2021-03-17: 15 mL via OROMUCOSAL
  Filled 2021-03-17: qty 15

## 2021-03-17 SURGICAL SUPPLY — 46 items
ADH SKN CLS APL DERMABOND .7 (GAUZE/BANDAGES/DRESSINGS) ×2
APL PRP STRL LF DISP 70% ISPRP (MISCELLANEOUS) ×1
BLADE SURG 15 STRL LF DISP TIS (BLADE) ×4 IMPLANT
BLADE SURG 15 STRL SS (BLADE) ×4
CHLORAPREP W/TINT 26 (MISCELLANEOUS) ×3 IMPLANT
CLIP TI WIDE RED SMALL 6 (CLIP) ×3 IMPLANT
CNTNR SPEC 2.5X3XGRAD LEK (MISCELLANEOUS) ×1
CONT SPEC 4OZ STER OR WHT (MISCELLANEOUS) ×1
CONT SPEC 4OZ STRL OR WHT (MISCELLANEOUS) ×1
CONTAINER SPEC 2.5X3XGRAD LEK (MISCELLANEOUS) ×2 IMPLANT
DERMABOND ADVANCED (GAUZE/BANDAGES/DRESSINGS) ×2
DERMABOND ADVANCED .7 DNX12 (GAUZE/BANDAGES/DRESSINGS) ×4 IMPLANT
DEVICE DSSCT PLSMBLD 3.0S LGHT (MISCELLANEOUS) IMPLANT
DRAPE LAPAROTOMY 77X122 PED (DRAPES) ×3 IMPLANT
DRSG GAUZE FLUFF 36X18 (GAUZE/BANDAGES/DRESSINGS) ×3 IMPLANT
ELECT CAUTERY BLADE TIP 2.5 (TIP) ×2
ELECT REM PT RETURN 9FT ADLT (ELECTROSURGICAL) ×2
ELECTRODE CAUTERY BLDE TIP 2.5 (TIP) ×2 IMPLANT
ELECTRODE REM PT RTRN 9FT ADLT (ELECTROSURGICAL) ×2 IMPLANT
GAUZE 4X4 16PLY ~~LOC~~+RFID DBL (SPONGE) ×3 IMPLANT
GLOVE SURG SYN 6.5 ES PF (GLOVE) ×2 IMPLANT
GLOVE SURG SYN 6.5 PF PI (GLOVE) ×1 IMPLANT
GLOVE SURG UNDER LTX SZ7 (GLOVE) ×6 IMPLANT
GOWN STRL REUS W/ TWL LRG LVL3 (GOWN DISPOSABLE) ×4 IMPLANT
GOWN STRL REUS W/TWL LRG LVL3 (GOWN DISPOSABLE) ×4
KIT MARKER MARGIN INK (KITS) IMPLANT
KIT TURNOVER KIT A (KITS) ×3 IMPLANT
LABEL OR SOLS (LABEL) ×3 IMPLANT
MANIFOLD NEPTUNE II (INSTRUMENTS) ×3 IMPLANT
MARKER MARGIN CORRECT CLIP (MARKER) IMPLANT
NDL HYPO 25X1 1.5 SAFETY (NEEDLE) ×1 IMPLANT
NEEDLE HYPO 25X1 1.5 SAFETY (NEEDLE) ×2 IMPLANT
PACK BASIN MINOR ARMC (MISCELLANEOUS) ×3 IMPLANT
PLASMABLADE 3.0S W/LIGHT (MISCELLANEOUS)
STRIP CLOSURE SKIN 1/2X4 (GAUZE/BANDAGES/DRESSINGS) ×6 IMPLANT
SUT MNCRL 4-0 (SUTURE) ×2
SUT MNCRL 4-0 27XMFL (SUTURE) ×1
SUT SILK 2 0 SH (SUTURE) ×3 IMPLANT
SUT VIC AB 3-0 SH 27 (SUTURE) ×2
SUT VIC AB 3-0 SH 27X BRD (SUTURE) ×2 IMPLANT
SUTURE MNCRL 4-0 27XMF (SUTURE) ×2 IMPLANT
SYR 10ML LL (SYRINGE) ×3 IMPLANT
SYR BULB IRRIG 60ML STRL (SYRINGE) ×3 IMPLANT
TUBING CONNECTING 10 (TUBING) IMPLANT
WATER STERILE IRR 1000ML POUR (IV SOLUTION) ×3 IMPLANT
WATER STERILE IRR 500ML POUR (IV SOLUTION) ×3 IMPLANT

## 2021-03-17 NOTE — H&P (Signed)
Patient drank coffee with cream at 9 am. Will reschedule for another day. Discussed with patient.

## 2021-03-17 NOTE — Progress Notes (Signed)
Pre-op work up completed. Denied having anything in her coffee when asked by the nurse. After being asked by the anesthesia doctor, patient admitted to having creamer in her coffee this morning at 9 am. Dr. Celine Ahr made aware that surgery would need to be postponed until 3 pm today. Dr. Celine Ahr cancelled the surgery today; to be rescheduled at a later date. Patient aware of the NPO requirements prior to surgery.

## 2021-03-18 ENCOUNTER — Telehealth: Payer: Self-pay | Admitting: General Surgery

## 2021-03-18 NOTE — Telephone Encounter (Signed)
Updated information regarding rescheduled surgery.  Left message for patient to call.  Please inform patient of Pre-Admission date/time, COVID Testing date and Surgery date.  Surgery Date: 03/31/21 Preadmission Testing Date: 03/10/21, already done.   Covid Testing Date: Not needed.   Also patient will need to call at [phone], between 1-3:00pm the day before surgery, to find out what time to arrive for surgery.    Also remind patient not to eat as this is the reason why her surgery had to be rescheduled, she had breakfast.

## 2021-03-20 ENCOUNTER — Other Ambulatory Visit: Payer: Self-pay | Admitting: General Surgery

## 2021-03-20 DIAGNOSIS — N649 Disorder of breast, unspecified: Secondary | ICD-10-CM

## 2021-03-20 NOTE — Telephone Encounter (Signed)
Left message again for patient to call.    

## 2021-03-21 NOTE — Telephone Encounter (Signed)
Patient is aware of her new surgery date for March 31, 2021

## 2021-03-26 ENCOUNTER — Encounter: Payer: Self-pay | Admitting: General Surgery

## 2021-03-28 ENCOUNTER — Telehealth: Payer: Self-pay | Admitting: Surgery

## 2021-03-28 NOTE — Telephone Encounter (Signed)
I called cell phone and home number at least 5 times today. Left messages but never heard back from the pt. She is schedule to have surgery with Dr. Celine Ahr this coming Monday but Dr. Celine Ahr is no longer part of our practice. We will try to reach again.

## 2021-03-31 ENCOUNTER — Encounter: Admission: RE | Payer: Self-pay | Source: Home / Self Care

## 2021-03-31 ENCOUNTER — Encounter: Payer: Self-pay | Admitting: Anesthesiology

## 2021-03-31 ENCOUNTER — Ambulatory Visit: Admission: RE | Admit: 2021-03-31 | Payer: Medicaid Other | Source: Home / Self Care | Admitting: Surgery

## 2021-03-31 ENCOUNTER — Telehealth: Payer: Self-pay | Admitting: Surgery

## 2021-03-31 SURGERY — EXCISION OF BREAST BIOPSY
Anesthesia: Choice | Laterality: Right

## 2021-03-31 MED ORDER — LACTATED RINGERS IV SOLN: INTRAVENOUS | Status: DC

## 2021-03-31 MED ORDER — ACETAMINOPHEN 500 MG PO TABS: 1000.0000 mg | ORAL_TABLET | ORAL | Status: DC

## 2021-03-31 MED ORDER — BUPIVACAINE LIPOSOME 1.3 % IJ SUSP: 20.0000 mL | Freq: Once | INTRAMUSCULAR | Status: DC

## 2021-03-31 MED ORDER — CEFAZOLIN SODIUM-DEXTROSE 2-4 GM/100ML-% IV SOLN: 2.0000 g | INTRAVENOUS | Status: DC

## 2021-03-31 MED ORDER — CHLORHEXIDINE GLUCONATE CLOTH 2 % EX PADS: 6.0000 | MEDICATED_PAD | Freq: Once | CUTANEOUS | Status: DC

## 2021-03-31 MED ORDER — GABAPENTIN 300 MG PO CAPS: 300.0000 mg | ORAL_CAPSULE | ORAL | Status: DC

## 2021-03-31 MED ORDER — CHLORHEXIDINE GLUCONATE 0.12 % MT SOLN: 15.0000 mL | Freq: Once | OROMUCOSAL | Status: DC

## 2021-03-31 MED ORDER — ORAL CARE MOUTH RINSE: 15.0000 mL | Freq: Once | OROMUCOSAL | Status: DC

## 2021-03-31 MED ORDER — CELECOXIB 200 MG PO CAPS: 200.0000 mg | ORAL_CAPSULE | ORAL | Status: DC

## 2021-03-31 MED ORDER — FAMOTIDINE 20 MG PO TABS: 20.0000 mg | ORAL_TABLET | Freq: Once | ORAL | Status: DC

## 2021-03-31 NOTE — Telephone Encounter (Signed)
Outgoing call is made to the patient.  She was told that she called the OR this morning to find out what time she needed to be there for surgery and she was told from someone at the hospital that her surgery was rescheduled.   I called the patient this morning, she unfortunately missed  Dr. Corlis Leak calls from 03/28/21, she was at work.  I did explain to the patient that Dr. Celine Ahr no longer with [place].  Offered her to see one of our other providers, she agreed and wants to see before rescheduling surgery.  She will be seeing Dr. Hampton Abbot on 04/14/21 and will reschedule surgery.

## 2021-04-14 ENCOUNTER — Encounter: Payer: Self-pay | Admitting: Surgery

## 2021-04-14 ENCOUNTER — Other Ambulatory Visit: Payer: Self-pay

## 2021-04-14 ENCOUNTER — Telehealth: Payer: Self-pay | Admitting: Surgery

## 2021-04-14 ENCOUNTER — Ambulatory Visit (INDEPENDENT_AMBULATORY_CARE_PROVIDER_SITE_OTHER): Payer: Self-pay | Admitting: Surgery

## 2021-04-14 VITALS — BP 134/91 | HR 73 | Temp 98.3°F | Ht 68.0 in | Wt 179.2 lb

## 2021-04-14 DIAGNOSIS — N611 Abscess of the breast and nipple: Secondary | ICD-10-CM

## 2021-04-14 NOTE — Patient Instructions (Addendum)
Our surgery scheduler Pamala Hurry will call you within 24-48 hours to get you scheduled. If you have not heard from her after 48 hours, please call our office. You will not need to get Covid tested before surgery and have the blue sheet available when she calls to write down important information.   If you have any concerns or question, please feel free to call our office.   Excision of Skin Lesions Excision of a skin lesion is the removal of a section of skin by making small cuts (incisions) in the skin. Through this process, the lesion is completely removed. This procedure is often done to treat or prevent cancer or infection. It may also be done to improve cosmetic appearance. The procedure may be done to remove: Cancerous (malignant) growths, such as basal cell carcinoma, squamous cell carcinoma, or melanoma. Noncancerous (benign) growths, such as a cyst or lipoma. Growths, such as moles or skin tags, which may be removed for cosmetic reasons. Various excision or surgical techniques may be used depending on your condition, the location of the lesion, and your overall health. Tell a health care provider about: Any allergies you have. All medicines you are taking, including vitamins, herbs, eye drops, creams, and over-the-counter medicines. Any problems you or family members have had with anesthetic medicines. Any blood disorders you have. Any surgeries you have had. Any medical conditions you have or have had. Whether you are pregnant or may be pregnant. What are the risks? Generally, this is a safe procedure. However, problems may occur, including: Bleeding. Infection. Scarring. Recurrence of the cyst, lipoma, or cancer. Changes in skin sensation or appearance, such as discoloration or swelling. Reaction to the anesthetics. Allergic reaction to surgical materials or ointments. Damage to nerves, blood vessels, muscles, or other structures. Continued pain. What happens before the  procedure? Medicines Ask your health care provider about: Changing or stopping your regular medicines. This is especially important if you are taking diabetes medicines or blood thinners. Taking medicines such as aspirin and ibuprofen. These medicines can thin your blood. Do not take these medicines unless your health care provider tells you to take them. Taking over-the-counter medicines, vitamins, herbs, and supplements. General instructions You may be asked to stop smoking. You may have an exam or testing. Ask your health care provider what steps will be taken to help prevent infection. These may include: Removing hair at the surgery site. Washing skin with a germ-killing soap. Taking antibiotic medicine. What happens during the procedure?  You will be given a medicine to numb the area (local anesthetic). Your health care provider will remove the lesions using one of the following excision techniques. Complete surgical excision. This procedure may be done to treat a cancerous growth or a noncancerous cyst or lesion. A small scalpel or scissors will be used to gently cut around and under the lesion until it is completely removed. If bleeding occurs, it will be stopped with a device that delivers heat (electrocautery). The edges of the wound may be stitched (sutured) together. A bandage (dressing) will be applied. Samples will be sent to a lab for testing. Excision of a cyst. An incision will be made on the cyst. The entire cyst will be removed through the incision. The incision may be closed with sutures. Shave excision. This may be done to remove a mole or a skin tag. A small blade or an electrically heated loop instrument will be used to shave off the lesion.  The wound is usually left to  heal on its own without sutures. Punch excision. This may be done to remove a mole or a scar or to do a biopsy of the lesion. A small tool that is like a cookie cutter or a hole punch is used to  cut a circle shape out of the skin. The outer edges of the skin will be sutured together. The sample may be sent to a lab for testing. Mohs micrographic surgery. This is usually done to treat skin cancer. This type of excision is mostly used on the face and ears. This procedure is minimally invasive, and it ensures the best cosmetic outcome. A scalpel or a loop instrument will be used to remove layers of the lesion until all the abnormal or cancerous tissue has been removed. The wound may be sutured, depending on its size. The tissue will be checked under a microscope right away. Each of the techniques may vary among health care providers and hospitals. At the end of any of these procedures, antibiotic ointment will be applied as needed. What happens after the procedure? Return to your normal activities as told by your health care provider. Ask your health care provider what activities are safe for you. It is up to you to get the results of your procedure. Ask your doctor, or the department that is doing the procedure, when your results will be ready. Talk with your health care provider to discuss any test results, treatment options, and if necessary, the need for more tests. Keep all follow-up visits as told by your health care provider. This is important. Summary Excision of a skin lesion is the removal of a section of skin by making small cuts (incisions) in the skin. This procedure is often done to treat or prevent cancer and infection, or it may be done to improve cosmetic appearance. Various excision or surgical techniques may be used depending on your condition, the location of the lesion, and your overall health. After the procedure, talk with your health care provider to discuss any test results, treatment options, and if necessary, the need for more tests. Keep all follow-up visits as told by your health care provider. This is important. This information is not intended to replace advice  given to you by your health care provider. Make sure you discuss any questions you have with your health care provider. Document Revised: 10/28/2020 Document Reviewed: 12/22/2017 Elsevier Patient Education  [place].

## 2021-04-14 NOTE — Telephone Encounter (Signed)
Patient will call to schedule surgery, she wants to see what dates will be good for her.

## 2021-04-14 NOTE — Progress Notes (Signed)
04/14/2021  History of Present Illness: Claire Harris is a 45 y.o. 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  She was seen by Dr. Celine Ahr in the past but as she is no longer with our practice, I am taking over her care and scheduling her for surgery.  He had been initially scheduled with Dr. Celine Ahr for excision of this chronic wound on 03/17/2021 but the surgery had to be canceled as she had consumed milk prior to surgery.  Patient reports that she had nipple piercings many years ago and that she has been having issues intermittently in bilateral breasts.  The left breast had issues with drainage and wound but this healed after antibiotic treatment.  On the right side, this did not heal in the same way and she has developed chronic intermittent drainage from the medial.  Areolar area.  Also the nipple has become inverted.  As a precaution, she had a mammogram and ultrasound on 01/29/2021 which showed 2 areas of chronic infection in the medial right breast at 1:00 and 4:00 positions with distortion of the retroareolar right breast likely due to scarring.  Biopsy was done of the retroareolar area on 02/04/2021 which showed [place] with an area of fat necrosis and otherwise benign breast tissue without any atypia or malignancy.  Patient today presents for H&P update and to meet me as I will be taking over her care for Dr. Celine Ahr.  Denies any worsening symptoms and reports that she still has those 2 areas that will intermittently drain and also cause tenderness in the periareolar and retroareolar region.  Denies any troubles with the left breast.  Denies any fevers, chills, chest pain, shortness of breath.  Past Medical History: Past Medical History:  Diagnosis Date   Cervical cancer ([place]) 2018   cervical   Hx of hepatitis C    Inverted nipple 06/26/2020   right since May   Spinal headache    with first baby delivery,     Past Surgical History: Past  Surgical History:  Procedure Laterality Date   ABDOMINAL HYSTERECTOMY  2018   partial/radical   arm surgery Left 2012   BREAST BIOPSY Right 02/04/2021   affirm bx, x marker , path pending    Home Medications: Prior to Admission medications   Not on File    Allergies: No Known Allergies  Review of Systems: Review of Systems  Constitutional:  Negative for chills and fever.  HENT:  Negative for hearing loss.   Respiratory:  Negative for shortness of breath.   Cardiovascular:  Negative for chest pain.  Gastrointestinal:  Negative for abdominal pain, nausea and vomiting.  Genitourinary:  Negative for dysuria.  Musculoskeletal:  Negative for myalgias.  Skin:  Negative for rash.  Neurological:  Negative for dizziness.  Psychiatric/Behavioral:  Negative for depression.    Physical Exam BP (!) 134/91   Pulse 73   Temp 98.3 F (36.8 C) (Oral)   Ht 5\' 8"  (1.727 m)   Wt 179 lb 3.2 oz (81.3 kg)   SpO2 94%   BMI 27.25 kg/m  CONSTITUTIONAL: No acute distress, well-nourished HEENT:  Normocephalic, atraumatic, extraocular motion intact. RESPIRATORY:  Lungs are clear, and breath sounds are equal bilaterally. Normal respiratory effort without pathologic use of accessory muscles. CARDIOVASCULAR: Heart is regular without murmurs, gallops, or rubs. BREAST: Right breast has 2 wounds at the 1:00 and 4 o'clock position just medial to the areola consistent with the patient's wounds found  on ultrasound.  Currently there is no active drainage.  The wound at the 1 o'clock position measures about 1 cm in size and has some chronic skin changes and ulceration.  The 4:00 wound is smaller about 7 mm with only an area of small scab at the skin level.  The nipple itself is inverted with swelling and a firm nodule noted in the retroareolar space medial aspect.  Left breast without any palpable masses, nipple inversion, or new wounds or skin changes. GI: The abdomen is soft, nondistended. MUSCULOSKELETAL:  Normal gait, no peripheral edema SKIN: No other skin issues or wounds. NEUROLOGIC:  Motor and sensation is grossly normal.  Cranial nerves are grossly intact. PSYCH:  Alert and oriented to person, place and time. Affect is normal.  Labs/Imaging: Mammogram and ultrasound right breast on 01/29/2021: FINDINGS: There is persistent distortion in the retroareolar right breast in the oblique projection. This is at the level of the superior and inferior draining areas seen in the medial aspect of the breast. There are no separate findings associated with those draining areas.   On physical exam, the patient has 2 areas of focal skin indentation in the anteromedial right breast. Centrally, these are red in color with central scabs. There is a linear area of retraction of the lateral aspect of the right nipple. No mass is palpable.   Targeted ultrasound is performed, showing a 1.8 x 0.9 x 0.7 cm obliquely oriented, oval, hypoechoic area within the skin and retronipple region of the right breast centered in the 1 o'clock position, 1 cm from the nipple. This measured 2.1 x 0.6 x 0.5 cm on 06/26/2020.   In the 4 o'clock position of the right breast, 1 cm from the nipple, there is a 2.6 x 0.6 x 0.5 cm similar area in the skin and subcutaneous tissues. This measured 1.3 x 0.6 x 0.5 cm in corresponding dimensions on 06/26/2020.   The previously demonstrated right axillary lymph node with cortical thickening measuring up to 3.6 mm is smaller, currently with a cortex measuring up to 2.7 mm.   IMPRESSION: 1. No significant change in 2 areas of chronic infection/inflammation in the medial right breast. Granulomatous mastitis remains a possibility. 2. Persistent distortion in the retroareolar right breast in the oblique projection. This is most likely due to scarring due to the chronic infection/inflammation in the anterior right breast. Malignancy is less likely but not excluded. 3. Benign right  axillary lymph node, previously reactive.   RECOMMENDATION: 1. 3D stereotactic guided core needle biopsy of the persistent distortion in the retroareolar right breast. This will be scheduled in consultation with Dr. Celesta Aver. 2. Surgical consultation for the 2 areas of persistent, chronic infection/inflammation in the medial right breast.   Pathology R breast biopsy 02/04/21: DIAGNOSIS:  A. BREAST DISTORTION, RIGHT RETROAREOLAR; STEREOTACTIC BIOPSY:  - BENIGN BREAST TISSUE WITH PSEUDO ANGIOMATOUS STROMAL HYPERPLASIA, AREA  OF ORGANIZING FAT NECROSIS, AND COLUMNAR CELL CHANGE.  - NEGATIVE FOR ATYPIA AND MALIGNANCY.   Assessment and Plan: This is a 45 y.o. female with a chronic right breast wound or abscess.  - Discussed with the patient the findings on her imaging studies and pathology results.  On exam, the patient has 2 wounds with some ulceration particular at the 1 o'clock position and inversion of the right nipple.  All this is related to infection in the retroareolar space and associated scar tissue.  Discussed with the patient that the plan will be to perform an excision of these 2  chronic wounds with drainage and debridement of the infected tissue in the retroareolar space.  Discussed with her the risks for bleeding, infection, injury to surrounding structures.  Also discussed with her the possibility of having to leave the wound open depending on the degree of infection or inflammation versus being able to somewhat close it or fully close to depending on the same.  If the wound remains open, then she will also need packing of the wound and inform her that we would do strip gauze for this.  It is less likely that we will be able to fully close the wound given the chronic infection.  The patient understands this and she is willing to proceed.  She is ready to have this wound over with. - Patient will contact her workplace to see when she will be able to be free but hopes for surgery next  week.  We will await her phone call but we will start placing orders for her surgery.  Face-to-face time spent with the patient and care providers was 40 minutes, with more than 50% of the time spent counseling, educating, and coordinating care of the patient.     Melvyn Neth, [facility]

## 2021-04-17 ENCOUNTER — Telehealth: Payer: Self-pay | Admitting: Surgery

## 2021-04-17 NOTE — Telephone Encounter (Signed)
Outgoing call, left message for patient to call. She hasn't called back yet regarding scheduling for surgery with Dr. Hampton Abbot.  Want to go over some dates with her to get scheduled.

## 2021-04-18 NOTE — Telephone Encounter (Signed)
To date patient has yet to call back and schedule surgery.  Multiple attempts have been made, messages left.

## 2021-04-23 ENCOUNTER — Telehealth: Payer: Self-pay | Admitting: Surgery

## 2021-04-23 NOTE — Telephone Encounter (Signed)
Outgoing call is made again.  This time was able to speak with the patient. She states has not been able to call back and she has been putting in a lot of hours, working extra shifts at her place of work.  She wants to sit down with her manager and see what will be a good date for her.  Once she is able to do this will call us back.

## 2021-06-09 ENCOUNTER — Telehealth: Payer: Self-pay | Admitting: Surgery

## 2021-06-09 NOTE — Telephone Encounter (Signed)
Numerous attempts have been made to schedule surgery.  Patient with conflicting work schedule.  Was suppose to call us with dates that was good for her.  To date, no return call.  If patient calls, will need follow up in office with the doctor prior to rescheduling.

## 2021-06-10 ENCOUNTER — Encounter: Payer: Self-pay | Admitting: *Deleted

## 2021-06-10 NOTE — Progress Notes (Signed)
It has been 4 months since meeting with the patient.  She has not responded to surgical request to reschedule her surgery.  Will close as refusal to follow-up.

## 2023-06-22 ENCOUNTER — Emergency Department
Admission: EM | Admit: 2023-06-22 | Discharge: 2023-06-22 | Disposition: A | Discharge status: Home / Self Care | Payer: Medicaid Other | Attending: Emergency Medicine | Admitting: Emergency Medicine

## 2023-06-22 ENCOUNTER — Other Ambulatory Visit: Payer: Self-pay

## 2023-06-22 DIAGNOSIS — R0781 Pleurodynia: Secondary | ICD-10-CM | POA: Insufficient documentation

## 2023-06-22 DIAGNOSIS — Z5321 Procedure and treatment not carried out due to patient leaving prior to being seen by health care provider: Secondary | ICD-10-CM | POA: Diagnosis not present

## 2023-06-22 LAB — COMPREHENSIVE METABOLIC PANEL
ALT: 36 U/L (ref 0–44)
AST: 28 U/L (ref 15–41)
Albumin: 4.3 g/dL (ref 3.5–5.0)
Alkaline Phosphatase: 73 U/L (ref 38–126)
Anion gap: 11 (ref 5–15)
BUN: 12 mg/dL (ref 6–20)
CO2: 22 mmol/L (ref 22–32)
Calcium: 9 mg/dL (ref 8.9–10.3)
Chloride: 103 mmol/L (ref 98–111)
Creatinine, Ser: 0.83 mg/dL (ref 0.44–1.00)
GFR, Estimated: >60 mL/min (ref >60)
Glucose, Bld: 121 mg/dL — ABNORMAL HIGH (ref 70–99)
Potassium: 3.9 mmol/L (ref 3.5–5.1)
Sodium: 136 mmol/L (ref 135–145)
Total Bilirubin: 0.6 mg/dL (ref <1.2)
Total Protein: 7.9 g/dL (ref 6.5–8.1)

## 2023-06-22 LAB — CBC
HCT: 43.1 % (ref 36.0–46.0)
Hemoglobin: 14.9 g/dL (ref 12.0–15.0)
MCH: 33.3 pg (ref 26.0–34.0)
MCHC: 34.6 g/dL (ref 30.0–36.0)
MCV: 96.2 fL (ref 80.0–100.0)
Platelets: 192 10*3/uL (ref 150–400)
RBC: 4.48 MIL/uL (ref 3.87–5.11)
RDW: 13.8 % (ref 11.5–15.5)
WBC: 8.8 10*3/uL (ref 4.0–10.5)
nRBC: 0 % (ref 0.0–0.2)

## 2023-06-22 LAB — LIPASE, BLOOD: Lipase: 31 U/L (ref 11–51)

## 2023-06-22 NOTE — ED Triage Notes (Signed)
Pt comes with c/o possible hernia and left side rib pain. Pt states it started few days ago. Pt states today it got worse and then she felt a knot on her stomach. Pt denies any N/V/D and urinary symptoms.
